# Patient Record
Sex: Male | Born: 1940 | Race: Asian | Hispanic: No | Marital: Married | Smoking: Former smoker
Health system: Southern US, Community
[De-identification: ages and names within clinical notes are randomized; demographics above are authoritative.]

## PROBLEM LIST (undated history)

## (undated) DIAGNOSIS — I10 Essential (primary) hypertension: Secondary | ICD-10-CM

## (undated) DIAGNOSIS — R51 Headache: Secondary | ICD-10-CM

## (undated) DIAGNOSIS — R519 Headache, unspecified: Secondary | ICD-10-CM

## (undated) DIAGNOSIS — J189 Pneumonia, unspecified organism: Secondary | ICD-10-CM

## (undated) DIAGNOSIS — I639 Cerebral infarction, unspecified: Secondary | ICD-10-CM

## (undated) DIAGNOSIS — F329 Major depressive disorder, single episode, unspecified: Secondary | ICD-10-CM

## (undated) DIAGNOSIS — I252 Old myocardial infarction: Secondary | ICD-10-CM

## (undated) DIAGNOSIS — F32A Depression, unspecified: Secondary | ICD-10-CM

## (undated) DIAGNOSIS — I209 Angina pectoris, unspecified: Secondary | ICD-10-CM

## (undated) DIAGNOSIS — N400 Enlarged prostate without lower urinary tract symptoms: Secondary | ICD-10-CM

## (undated) HISTORY — PX: CATARACT EXTRACTION, BILATERAL: SHX1313

## (undated) HISTORY — DX: Old myocardial infarction: I25.2

---

## 2006-11-27 ENCOUNTER — Inpatient Hospital Stay (HOSPITAL_COMMUNITY): Admission: EM | Admit: 2006-11-27 | Discharge: 2006-11-29 | Payer: Self-pay | Admitting: Emergency Medicine

## 2007-05-16 HISTORY — PX: CARDIAC CATHETERIZATION: SHX172

## 2007-07-09 ENCOUNTER — Emergency Department (HOSPITAL_COMMUNITY): Admission: EM | Admit: 2007-07-09 | Discharge: 2007-07-10 | Payer: Self-pay | Admitting: Emergency Medicine

## 2007-09-10 ENCOUNTER — Ambulatory Visit (HOSPITAL_COMMUNITY): Admission: RE | Admit: 2007-09-10 | Discharge: 2007-09-10 | Payer: Self-pay | Admitting: Chiropractic Medicine

## 2009-07-02 ENCOUNTER — Ambulatory Visit: Payer: Self-pay | Admitting: Family Medicine

## 2010-03-30 ENCOUNTER — Encounter (INDEPENDENT_AMBULATORY_CARE_PROVIDER_SITE_OTHER): Payer: Self-pay | Admitting: *Deleted

## 2010-03-30 LAB — CONVERTED CEMR LAB
CO2: 28 meq/L (ref 19–32)
Creatinine, Ser: 0.86 mg/dL (ref 0.40–1.50)
Glucose, Bld: 89 mg/dL (ref 70–99)
Sodium: 139 meq/L (ref 135–145)
TSH: 1.231 microintl units/mL (ref 0.350–4.500)
Total Bilirubin: 0.3 mg/dL (ref 0.3–1.2)
Total Protein: 7.5 g/dL (ref 6.0–8.3)

## 2010-09-27 NOTE — Cardiovascular Report (Signed)
NAMERomeo, Jacob Pope                    ACCOUNT NO.:  [id number]   MEDICAL RECORD NO.:  [id number]          PATIENT TYPE:  INP   LOCATION:  3707                         FACILITY:  [facility]   PHYSICIAN:  Nicki Guadalajara, M.D.     DATE OF BIRTH:  Mar 20, 1941   DATE OF PROCEDURE:  11/28/2006  DATE OF DISCHARGE:                            CARDIAC CATHETERIZATION   INDICATIONS:  [name] is a 70 year old [place]  gentleman who reportedly gives a history of suffering heart problems  with possible heart attack while in [place] several years ago.  Apparently, the patient was admitted yesterday by Dr. Elsie Lincoln with chest  pain.  The patient does not speak English and it was difficult to  elucidate a history.  Apparently Dr. Elsie Lincoln raised the possibility of  him having suffered an old anteroseptal MI.  Definitive diagnostic  catheterization was recommended.   PROCEDURE:  After premedication with Valium 5 mg intravenously, the  patient was prepped and draped in the usual fashion.  His right femoral  artery was punctured anteriorly and a 5-French sheath was inserted.  Diagnostic cardiac catheterization was done utilizing 5-French Judkins  for left and right coronary catheters.  A 5-French pigtail catheter was  used for biplane selective arteriography.  Distal aortography was also  performed.  Hemostasis was obtained by direct manual pressure.  The  patient tolerated the procedure well.   HEMODYNAMIC DATA:  Central aortic pressure was 160/74.  Left ventricular  pressure is 160/16.   ANGIOGRAPHIC DATA:  The left main coronary artery had an upper takeoff  and was angiographically normal and bifurcated into the LAD and left  circumflex system.   The LAD was angiographically normal, gave rise to two diagonal vessels,  several septal perforating arteries and wrapped around the LV apex.   The left circumflex vessel gave rise to a small high  marginal/intermediate like vessel and one large  obtuse marginal vessel  and was free of significant disease.   The right coronary artery was a large-caliber dominant vessel which was  angiographically normal.  The initial injection did raise the  possibility of possible spasm proximally but subsequent injections  showed this had completely resolved spontaneously.   Biplane selective arteriography revealed preserved global contractility.  On the LAO projection there was a questionable subtle region of minimal  inferolateral hypercontractility but this was borderline and still  contracted well when compared to the other walls.   Distal aortography did not demonstrate any renal artery stenosis.  There  was no significant aortoiliac disease.   IMPRESSION:  1. Normal LV function with questionable borderline subtle region of      very minimal hypocontractility inferolaterally.  2. Essentially normal coronary arteries.           ______________________________  Nicki Guadalajara, M.D.     TK/MEDQ  D:  11/28/2006  T:  11/29/2006  Job:  [id number]   cc:   Madaline Savage, M.D.

## 2010-09-27 NOTE — Discharge Summary (Signed)
NAME:  Jacob Pope, [name]                    ACCOUNT NO.:  [id number]   MEDICAL RECORD NO.:  [id number]          PATIENT TYPE:  INP   LOCATION:  3707                         FACILITY:  [facility]   PHYSICIAN:  Abelino Derrick, P.A.   DATE OF BIRTH:  August 30, 1940   DATE OF ADMISSION:  11/27/2006  DATE OF DISCHARGE:                               DISCHARGE SUMMARY   DISCHARGE DIAGNOSES:  1. Unstable angina on admission with catheterization this admission      revealing normal coronaries.  2. Past history of myocardial infarction x2 per the patient.  3. Hypertension.  4. History of smoking.   HOSPITAL COURSE:  Patient is a 70 year old [place] male, who has been  in this country 2-1/2 months.  His daughter has been here 8 years and  she helped [place] with translation.  Apparently, according to the patient,  he had a MI x2 in [place] in the past.  He was on indapamide for  hypertension prior to admission.  He presented 11/27/06 with substernal  chest pain, worrisome for unstable angina.  He was seen by Dr. Elsie Lincoln  and admitted to telemetry, started on IV heparin, beta blocker, Norvasc,  ACE inhibitor and statin, and an aspirin.  Troponin were negative.  He  was set up for diagnostic catheterization, which was done 11/28/06 and  revealed normal coronaries and normal LV function, normal renal arteries  and normal iliacs.  He tolerated this well.  We feel he can be  discharged November 29, 2006.  We will trim his medicines back to just  lisinopril 5 mg once a day for hypertension and an aspirin a day.  He  will see Dr. Elsie Lincoln in followup.  He will need a primary care doctor,  Dr. Elsie Lincoln suggested Dr. Mikeal Hawthorne.   LABORATORY DATA:  White count 7.9, hemoglobin 14.5, hematocrit 44.4,  platelets 187, sodium 137, potassium 4.4, BUN 9, creatinine 0.9.  Liver  functions are normal.  CK-MB and troponin are negative x2.  LDL is 145.  HDL is 39.  BNP is less than 30.  TSH 1.0.  Chest x-ray - no acute  findings.  INR 0.9.   EKG shows sinus rhythm, sinus bradycardia, left  axis deviation, Q wave in V1 and 2.   DISPOSITION:  Patient is discharged in stable condition and will follow  up with Dr. Elsie Lincoln as an outpatient.  He may need further adjustment of  his medications.      Abelino Derrick, P.ALenard Lance  D:  11/29/2006  T:  11/29/2006  Job:  [id number]   cc:   Madaline Savage, M.D.

## 2011-02-27 LAB — I-STAT 8, (EC8 V) (CONVERTED LAB)
Acid-Base Excess: 3 — ABNORMAL HIGH
BUN: 16
Bicarbonate: 27.6 — ABNORMAL HIGH
Chloride: 104
Glucose, Bld: 97
HCT: 47
Hemoglobin: 16
Operator id: 189501
Potassium: 3.6
Sodium: 139
TCO2: 29
pCO2, Ven: 40.8 — ABNORMAL LOW
pH, Ven: 7.438 — ABNORMAL HIGH

## 2011-02-27 LAB — COMPREHENSIVE METABOLIC PANEL
ALT: 31
Albumin: 3.5
Alkaline Phosphatase: 45
BUN: 15
Chloride: 101
Glucose, Bld: 79
Potassium: 3.5
Sodium: 135
Total Bilirubin: 0.4

## 2011-02-27 LAB — POCT CARDIAC MARKERS
CKMB, poc: 1.1
CKMB, poc: 1.3
Myoglobin, poc: 40.2
Myoglobin, poc: 45.6
Myoglobin, poc: 45.7
Operator id: 189501
Operator id: 189501
Troponin i, poc: <0.05

## 2011-02-27 LAB — LIPID PANEL
Cholesterol: 200
LDL Cholesterol: 145 — ABNORMAL HIGH
Triglycerides: 81
VLDL: 16

## 2011-02-27 LAB — CBC
HCT: 42.4
Hemoglobin: 14
MCHC: 33
MCV: 80.2
Platelets: 187
RBC: 5.35
RBC: 5.53
RDW: 14.4 — ABNORMAL HIGH
WBC: 7.9

## 2011-02-27 LAB — HEPARIN LEVEL (UNFRACTIONATED): Heparin Unfractionated: <0.1 — ABNORMAL LOW

## 2011-02-27 LAB — CARDIAC PANEL(CRET KIN+CKTOT+MB+TROPI)
CK, MB: 2.6
CK, MB: 2.8
Relative Index: 1.8
Relative Index: 2.2
Total CK: 129

## 2011-02-27 LAB — WOUND CULTURE: Culture: NO GROWTH

## 2011-02-27 LAB — POCT I-STAT CREATININE
Creatinine, Ser: 1.1
Operator id: 189501

## 2011-02-27 LAB — ANAEROBIC CULTURE

## 2011-02-27 LAB — BASIC METABOLIC PANEL
BUN: 9
Chloride: 104
Creatinine, Ser: 0.92
GFR calc Af Amer: >60
GFR calc non Af Amer: >60

## 2011-02-27 LAB — TSH: TSH: 1.01

## 2011-02-27 LAB — PROTIME-INR
INR: 0.9
Prothrombin Time: 12.5

## 2011-02-27 LAB — B-NATRIURETIC PEPTIDE (CONVERTED LAB): Pro B Natriuretic peptide (BNP): <30

## 2011-08-05 ENCOUNTER — Other Ambulatory Visit: Payer: Self-pay

## 2011-08-05 ENCOUNTER — Emergency Department (HOSPITAL_COMMUNITY): Payer: Self-pay

## 2011-08-05 ENCOUNTER — Encounter (HOSPITAL_COMMUNITY): Payer: Self-pay | Admitting: Emergency Medicine

## 2011-08-05 ENCOUNTER — Emergency Department (HOSPITAL_COMMUNITY)
Admission: EM | Admit: 2011-08-05 | Discharge: 2011-08-05 | Disposition: A | Discharge status: Home / Self Care | Payer: Self-pay | Attending: Emergency Medicine | Admitting: Emergency Medicine

## 2011-08-05 DIAGNOSIS — R5381 Other malaise: Secondary | ICD-10-CM | POA: Insufficient documentation

## 2011-08-05 DIAGNOSIS — R059 Cough, unspecified: Secondary | ICD-10-CM | POA: Insufficient documentation

## 2011-08-05 DIAGNOSIS — Z79899 Other long term (current) drug therapy: Secondary | ICD-10-CM | POA: Insufficient documentation

## 2011-08-05 DIAGNOSIS — E871 Hypo-osmolality and hyponatremia: Secondary | ICD-10-CM | POA: Insufficient documentation

## 2011-08-05 DIAGNOSIS — R05 Cough: Secondary | ICD-10-CM | POA: Insufficient documentation

## 2011-08-05 DIAGNOSIS — R6883 Chills (without fever): Secondary | ICD-10-CM | POA: Insufficient documentation

## 2011-08-05 DIAGNOSIS — R51 Headache: Secondary | ICD-10-CM | POA: Insufficient documentation

## 2011-08-05 DIAGNOSIS — R0789 Other chest pain: Secondary | ICD-10-CM | POA: Insufficient documentation

## 2011-08-05 DIAGNOSIS — I10 Essential (primary) hypertension: Secondary | ICD-10-CM | POA: Insufficient documentation

## 2011-08-05 DIAGNOSIS — R0602 Shortness of breath: Secondary | ICD-10-CM | POA: Insufficient documentation

## 2011-08-05 DIAGNOSIS — J189 Pneumonia, unspecified organism: Secondary | ICD-10-CM | POA: Insufficient documentation

## 2011-08-05 HISTORY — DX: Essential (primary) hypertension: I10

## 2011-08-05 LAB — BASIC METABOLIC PANEL
BUN: 9 mg/dL (ref 6–23)
CO2: 26 mEq/L (ref 19–32)
Chloride: 94 mEq/L — ABNORMAL LOW (ref 96–112)
Creatinine, Ser: 0.85 mg/dL (ref 0.50–1.35)
GFR calc Af Amer: >90 mL/min (ref >90)
Glucose, Bld: 100 mg/dL — ABNORMAL HIGH (ref 70–99)
Potassium: 3.8 mEq/L (ref 3.5–5.1)

## 2011-08-05 LAB — DIFFERENTIAL
Basophils Relative: 1 % (ref 0–1)
Lymphocytes Relative: 43 % (ref 12–46)
Lymphs Abs: 1.8 10*3/uL (ref 0.7–4.0)
Monocytes Absolute: 0.7 10*3/uL (ref 0.1–1.0)
Monocytes Relative: 16 % — ABNORMAL HIGH (ref 3–12)
Neutro Abs: 1.6 10*3/uL — ABNORMAL LOW (ref 1.7–7.7)
Neutrophils Relative %: 39 % — ABNORMAL LOW (ref 43–77)

## 2011-08-05 LAB — CBC
HCT: 40.4 % (ref 39.0–52.0)
Hemoglobin: 13.9 g/dL (ref 13.0–17.0)
MCHC: 34.4 g/dL (ref 30.0–36.0)
RBC: 5.18 MIL/uL (ref 4.22–5.81)
WBC: 4.4 10*3/uL (ref 4.0–10.5)

## 2011-08-05 MED ORDER — DEXTROSE 5 % IV SOLN: 500.0000 mg | Freq: Once | INTRAVENOUS | Status: DC

## 2011-08-05 MED ORDER — SODIUM CHLORIDE 0.9 % IV SOLN
Freq: Once | INTRAVENOUS | Status: AC
  Administered 2011-08-05: 18:00:00 via INTRAVENOUS

## 2011-08-05 MED ORDER — SODIUM CHLORIDE 0.9 % IV BOLUS (SEPSIS)
1000.0000 mL | Freq: Once | INTRAVENOUS | Status: AC
  Administered 2011-08-05: 1000 mL via INTRAVENOUS

## 2011-08-05 MED ORDER — ACETAMINOPHEN 325 MG PO TABS
650.0000 mg | ORAL_TABLET | Freq: Once | ORAL | Status: AC
  Administered 2011-08-05: 650 mg via ORAL
  Filled 2011-08-05: qty 2

## 2011-08-05 MED ORDER — DEXTROSE 5 % IV SOLN: 1.0000 g | Freq: Once | INTRAVENOUS | Status: DC

## 2011-08-05 MED ORDER — MOXIFLOXACIN HCL IN NACL 400 MG/250ML IV SOLN
400.0000 mg | Freq: Once | INTRAVENOUS | Status: AC
  Administered 2011-08-05: 400 mg via INTRAVENOUS
  Filled 2011-08-05: qty 250

## 2011-08-05 MED ORDER — MOXIFLOXACIN HCL 400 MG PO TABS: 400.0000 mg | ORAL_TABLET | Freq: Every day | ORAL | Status: AC

## 2011-08-05 NOTE — ED Notes (Signed)
Pt's daughter, Vanecek, [phone], went to cafeteria and will return.

## 2011-08-05 NOTE — Discharge Instructions (Signed)
Jacob Pope lab work showed slightly low sodium level, otherwise normal. His chest x-ray showed pneumonia. Take avelox as prescribed for the infection. Follow up with primary care doctor in 3-5 days for recheck. His MRI showed no acute/new findings to explain his symptoms. It also showed what looked like a possible prior hemorraghic stroke or trauma. This is something he needs to follow up with his neurologist for next week. Return if symptoms are worsening.   B?nh Vim Ph?i ? Ng??i L?n (Pneumonia, Adult) Vim ph?i l b?nh nhi?m trng ph?i.  NGUYN NHN N c th? gy ra b?i vi khu?n ho?c vi rt. Thng th??ng, cc nhi?m trng ny l do ht cc h?t truy?n nhi?m vo ph?i (???ng h h?p). TRI?U CH?NG   Ho.   S?t.   ?au ng?c.   Th? nhanh.   Kh kh.   S?n sinh d?ch nh?y.  CH?N ?ON N?u b?n c cc tri?u ch?ng ph? bi?n c?a b?nh vim ph?i, chuyn gia ch?m [place] y t? c?a b?n th??ng s? xc nh?n ch?n ?on b?ng X-quang ph?i. X-quang s? hi?n th? s? b?t th??ng trong ph?i (thm nh?p vo ph?i) n?u b?n b? vim ph?i. Cc xt nghi?m mu, n??c ti?u ho?c ??m c th? ???c th?c hi?n ?? tm ra nguyn nhn c? th? c?a b?nh vim ph?i c?a b?n. Chuyn gia ch?m [place] y t? c?a b?n c th? lm cc xt nghi?m (?o p l?c kh trong mu ho?c ?o ?? bo ha -xy) ?? xem ph?i c?a b?n ?ang lm vi?c nh? th? no. ?I?U TR?  M?t s? d?ng vim ph?i c th? ly lan sang ng??i khc khi b?n ho ho?c h?t h?i. B?n c th? ???c yu c?u ?eo kh?u trang tr??c v trong qu trnh khm. Vim ph?i do vi khu?n ???c ?i?u tr? b?ng thu?c khng sinh. Vim ph?i do vi rt cm c th? ???c ?i?u tr? b?ng thu?c khng vi rt. H?u h?t cc b?nh nhi?m vi rt khc ph?i ?i h?t ti?n trnh c?a chng. Cc b?nh nhi?m trng ny s? khng ?p ?ng v?i thu?c khng sinh.  PHNG NG?A Thu?c ch?ng ng?a (lo?i tim) nhi?m ph? c?u trng gip phng ng?a vim ph?i do ph? c?u trng. ?i?u ny th??ng ???c ?? ngh? cho:  Nh?ng ng??i trn 64 tu?i.   B?nh nhn ha tr? li?u.   Nh?ng ng??i c v?n  ?? v? ph?i mn tnh, ch?ng h?n nh? vim ti?u ph? qu?n ho?c kh th?ng.   Nh?ng ng??i c v?n ?? h? th?ng mi?n d?ch.  N?u b?n trn 64 tu?i ho?c c m?t tnh tr?ng nguy c? cao, b?n c th? nh?n ???c v?cxin ph? c?u khu?n n?u b?n khng nh?n ???c n tr??c ?. ? m?t s? n??c, v?cxin cm th??ng xuyn c?ng ???c ?? ngh?Marland Kitchen V?cxin ny c th? gip phng ng?a m?t s? tr??ng h?p vim ph?i. B?n c th? s? ???c cung c?p v?cxin cm nh? l m?t ph?n c?a s? ch?m [place].  N?u b?n ht thu?c, ? t?i lc b? thu?c. B?n c th? ???c h??ng d?n v? cch d?ng ht thu?c t?t nh?t. Chuyn gia ch?m [place] y t? c?a b?n c th? cung c?p thu?c v t? v?n ?? gip b?n b? thu?c l. H??NG D?N CH?M [place] T?I NH  Thu?c ho c th? ???c s? d?ng n?u b?n khng ???c ngh? ng?i nhi?u. Tuy nhin, ho b?o v? b?n b?ng cch lm s?ch ph?i. B?n nn trnh s? d?ng thu?c ho n?u c th?Shaune Pascal gia ch?m [place] y t? c?a b?n  c th? ? k ??n thu?c n?u ngh? r?ng b?n b? vim do vi khu?n ho?c cm. Dng h?t chng ngay c? khi b?n b?t ??u c?m th?y kh h?n.   Bc s? ? c?ng c th? k ??n thu?c long ??m c tc d?ng lm bong ??m ?? ho ra.   Ch? dng cc thu?c ???c bn khng c?n ??n thu?c c?a Bc s? ho?c theo toa c?a Bc s? ?? gi?m ?au, kh ch?u, hay s?t theo nh? h??ng d?n c?a Bc s?.   Khng ht thu?c l. Ht thu?c l l nguyn nhn ph? bi?n c?a vim ph? qu?n v c th? gp ph?n vo vim ph?i. N?u b?n ht thu?c v ti?p t?c ht thu?c, ch?ng ho c?a b?n c th? ko di vi tu?n sau khi h?t vim ph?i.   My phun h?i n??c mt ho?c my t?o h?i ?m trong phng hay nh b?n c th? gip lm long d?ch nh?y.   Ho th??ng n?ng h?n vo ban ?m. Ng? ? t? th? n?a th?ng ??ng trong m?t chi?c gh? t?a ho?c s? d?ng m?t ?i g?i d??i ??u s? gip lm d?u b?t v?n ?? ny.   Ngh? ng?i khi b?n c?m th?y c?n. C? th? c?a b?n th??ng s? cho b?n bi?t khi no c?n ngh? ng?i.  HY NGAY L?P T?C THAM V?N V?I CHUYN GIA Y T? N?U:  B?nh c?a b?n tr? nn t? h?n. ?i?u ny ??c bi?t ?ng n?u b?n ? c tu?i ho?c b? suy nh??c do b?t  k? b?nh no khc.   B?n khng th? ki?m sot b?nh ho b?ng thu?c ch?a ho v m?t ng?.   B?n b?t ??u ho ra mu.   B?n ngy cng ?au h?n ho?c u?ng thu?c khng c tc d?ng gi?m ?au.   B?n b? s?t.   B?t k? tri?u ch?ng no ban ??u ??a b?n ??n ?i?u tr? tr? nn nghim tr?ng h?n thay v t?t h?n.   B?n pht tri?n th? d?c ho?c ?au ng?c.  HY CH?C CH?N R?NG B?N:  Hi?u r nh?ng h??ng d?n khi xu?t vi?n.   S? theo di tnh tr?ng b?nh c?a b?n.   S? ??n khm b?nh ngay l?p t?c nh? ? ???c h??ng d?n.  Document Released: 05/01/2005 Document Revised: 04/20/2011 [facility] Patient Information 2012 [place], [place].

## 2011-08-05 NOTE — ED Provider Notes (Signed)
Pt headaches, weakness, elevated BP. Here in CDU awaiting CXR, MRI of  The head.  Filed Vitals:   08/05/11 1931  BP: 129/76  Pulse: 62  Temp: 97.8 F (36.6 C)  Resp: 22   Pt in NAD. Neurologically intact. AAOx3. Lungs clear bilaterally, regular HR and rhythm.   CAP on CXR. Avelox IV given in ED. PORT score of 71, Curb 65 score of 1, which puts him at a low risk group and outpatient treatment is appropriate. MRI back showing no acute findings, however, possible prior hemorrhagic ischemia or trauma. Results discussed with Dr. Landry Dyke and Dr.Guess who do not believe any further treatment necessary in ER or inpatient. Pt's VS are normal. He will be d/c home with outpatient follow up with neurology at [facility], and antibiotics with follow up with PCP for recheck.   Lottie Mussel, PA 08/05/11 2030

## 2011-08-05 NOTE — ED Notes (Signed)
Daughter stated, he's got headache and high blood pressure. WEnt to [facility]' for  The same and is scheduled for MRI

## 2011-08-05 NOTE — ED Provider Notes (Signed)
I saw and evaluated the patient, reviewed the resident's note and I agree with the findings and plan including ECG.  Intermittent headaches over the last couple weeks which can last for several minutes to several hours at a time which seemed to be a gradual onset but apparently when he has his headaches he has gait ataxia or resolves when his headaches resolved. He is no change in speech vision swallowing or understanding and no lateralizing weakness or numbness. When he is having his headaches when he is sitting still he apparently can feel and move his arms without obvious incoordination but when he tries to walk he apparently keeps bumping into the walls and it is difficult to tell with the language barrier whether or not he is having lightheadedness, vertigo, true gait ataxia, or just feels generally weak. As far as I can tell there've been no sudden onset headaches and no severe headaches after onset either. Headaches are gradual throbbing headache. He also has a cough with his lung examination essentially clear except for a few rhonchi and crackles at the left base only with unlabored breathing in the emergency department.   Hurman Horn, MD 08/07/11 2236

## 2011-08-05 NOTE — ED Notes (Signed)
Boneta Lucks, RN, gave report - pt in MRI at this time.

## 2011-08-05 NOTE — ED Notes (Signed)
Was seen and examined by Dr. Jamas Lav

## 2011-08-05 NOTE — ED Provider Notes (Signed)
History     CSN: [id number]  Arrival date & time 08/05/11  1312   First MD Initiated Contact with Patient 08/05/11 1420      Chief Complaint  Patient presents with  . Hypertension    (Consider location/radiation/quality/duration/timing/severity/associated sxs/prior treatment) HPI CC cough associated with chills, sob, chest pressure and fatigue onset 2 days ago.  Sx's worse with exertion, relieved with rest.  Also c/o left sided HA that has been present off and on for 3 weeks.  Mild, no associated sx's.  Seen by pcp and scheduled for outpt mri.   Past Medical History  Diagnosis Date  . Hypertension     History reviewed. No pertinent past surgical history.  History reviewed. No pertinent family history.  History  Substance Use Topics  . Smoking status: Current Everyday Smoker  . Smokeless tobacco: Not on file  . Alcohol Use: No      Review of Systems  Constitutional: Positive for chills and fatigue.  HENT: Negative for neck pain and neck stiffness.   Respiratory: Positive for cough and shortness of breath.   Cardiovascular: Positive for chest pain.  Gastrointestinal: Negative for nausea, vomiting, abdominal pain and diarrhea.  Neurological: Positive for headaches. Negative for weakness and numbness.  All other systems reviewed and are negative.    Allergies  Review of patient's allergies indicates no known allergies.  Home Medications   Current Outpatient Rx  Name Route Sig Dispense Refill  . LISINOPRIL 30 MG PO TABS Oral Take 30 mg by mouth daily.    . OSELTAMIVIR PHOSPHATE 75 MG PO CAPS Oral Take 75 mg by mouth daily. For 5 days; Start date 08/04/11    . MOXIFLOXACIN HCL 400 MG PO TABS Oral Take 1 tablet (400 mg total) by mouth daily. 7 tablet 0    BP 113/69  Pulse 64  Temp(Src) 97.8 F (36.6 C) (Oral)  Resp 21  SpO2 96%ra wnl  Physical Exam  Nursing note and vitals reviewed. Constitutional: He appears well-developed and well-nourished.  HENT:    Head: Normocephalic and atraumatic.  Eyes: EOM are normal. Pupils are equal, round, and reactive to light. Right eye exhibits no discharge. Left eye exhibits no discharge. Right eye exhibits no nystagmus. Left eye exhibits no nystagmus.  Neck: Normal range of motion. Neck supple.  Cardiovascular: Normal rate, regular rhythm and normal heart sounds.   Pulmonary/Chest: Effort normal. He has rhonchi (mild) in the right middle field and the right lower field.  Abdominal: Soft. There is no tenderness.  Musculoskeletal: Normal range of motion. He exhibits no tenderness.  Neurological: He is alert. He has normal strength. No sensory deficit. Coordination (good trunk and extremity coordination) normal. GCS eye subscore is 4. GCS verbal subscore is 5. GCS motor subscore is 6.  Skin: Skin is warm and dry.  Psychiatric: He has a normal mood and affect. His behavior is normal.    ED Course  Procedures (including critical care time)  Labs Reviewed  DIFFERENTIAL - Abnormal; Notable for the following:    Neutrophils Relative 39 (*)    Neutro Abs 1.6 (*)    Monocytes Relative 16 (*)    All other components within normal limits  BASIC METABOLIC PANEL - Abnormal; Notable for the following:    Sodium 129 (*)    Chloride 94 (*)    Glucose, Bld 100 (*)    GFR calc non Af Amer 86 (*)    All other components within normal limits  CBC  POCT  I-STAT TROPONIN I   Dg Chest 2 View  08/05/2011  *RADIOLOGY REPORT*  Clinical Data: Cough.  Shortness of breath.  Chills.  Headache. Smoker.  CHEST - 2 VIEW 08/05/2011:  Comparison: One-view chest x-ray 07/09/2007 and two-view chest x- ray 11/28/2006 [facility].  Findings: Cardiac silhouette mildly enlarged but stable.  Thoracic aorta atherosclerotic, unchanged.  Hilar and mediastinal contours otherwise unremarkable.  Streaky opacity with increased bronchovascular markings in the right lower lobe.  Lungs otherwise clear.  No pleural effusions.  Degenerative  changes involving the lower lumbar spine.  IMPRESSION: Right lower lobe bronchopneumonia.  Original Report Authenticated By: Arnell Sieving, M.D.   Mr [facility] Wo Contrast  08/05/2011  *RADIOLOGY REPORT*  Clinical Data:  Week for several days.  Headache.  MRI HEAD WITHOUT CONTRAST MRA HEAD WITHOUT CONTRAST  Technique: Multiplanar, multiecho pulse sequences of the brain and surrounding structures were obtained according to standard protocol without intravenous contrast.  Angiographic images of the head were obtained using MRA technique without contrast.  Comparison: 07/09/2007 head CT.  No comparison brain MR.  MRI HEAD  Findings:  No acute infarct.  Small area of blood breakdown products left cerebellum may be related to prior hemorrhagic ischemia or trauma.  Tiny cavernoma not entirely excluded.  Mild to slightly moderate small vessel disease type changes.  No intracranial mass lesion detected on this unenhanced motion degraded exam.  No hydrocephalus.  Partially empty sella incidentally noted.  Minimal ethmoid sinus and maxillary sinus mucosal thickening.  Mild spinal stenosis C3-4.  IMPRESSION: No acute infarct.  Small vessel disease type changes.  Please see above.  MRA HEAD  Findings: Anterior circulation without medium or large size vessel significant stenosis or occlusion.  Mild branch vessel irregularity.  Ectatic vertebral arteries and basilar artery without high-grade stenosis.  Left PICA not completely imaged.  Posterior cerebral artery mild branch vessel irregularity.  No aneurysm or vascular malformation noted.  IMPRESSION: Branch vessel atherosclerotic type changes as noted above.  Original Report Authenticated By: Fuller Canada, M.D.   Mr Brain Wo Contrast  08/05/2011  *RADIOLOGY REPORT*  Clinical Data:  Week for several days.  Headache.  MRI HEAD WITHOUT CONTRAST MRA HEAD WITHOUT CONTRAST  Technique: Multiplanar, multiecho pulse sequences of the brain and surrounding structures were  obtained according to standard protocol without intravenous contrast.  Angiographic images of the head were obtained using MRA technique without contrast.  Comparison: 07/09/2007 head CT.  No comparison brain MR.  MRI HEAD  Findings:  No acute infarct.  Small area of blood breakdown products left cerebellum may be related to prior hemorrhagic ischemia or trauma.  Tiny cavernoma not entirely excluded.  Mild to slightly moderate small vessel disease type changes.  No intracranial mass lesion detected on this unenhanced motion degraded exam.  No hydrocephalus.  Partially empty sella incidentally noted.  Minimal ethmoid sinus and maxillary sinus mucosal thickening.  Mild spinal stenosis C3-4.  IMPRESSION: No acute infarct.  Small vessel disease type changes.  Please see above.  MRA HEAD  Findings: Anterior circulation without medium or large size vessel significant stenosis or occlusion.  Mild branch vessel irregularity.  Ectatic vertebral arteries and basilar artery without high-grade stenosis.  Left PICA not completely imaged.  Posterior cerebral artery mild branch vessel irregularity.  No aneurysm or vascular malformation noted.  IMPRESSION: Branch vessel atherosclerotic type changes as noted above.  Original Report Authenticated By: Fuller Canada, M.D.     1. CAP (community acquired  pneumonia)   2. Headache   3. Hyponatremia      EKG: unchanged from previous tracings, LAD, normal sinus rhythm, no st/t changes.  MDM  Pt is in nad, afvss, nontoxic appearing, exam and hx as above. C/f pna, getting cxr and labs.  Doubt acs, ekg nl, had cath 4.5 yrs ago that was clean, getting trop.  Wells score is 0, not tachy, no dvt s/sx, no hypoxia, doubt PE.    Care transferred to CDU, labs and imaging pending      Elijio Miles, MD 08/06/11 [phone]

## 2011-08-05 NOTE — ED Notes (Signed)
Called MRI and they are preparing a cd for pt to take with him to [facility] next week.

## 2011-08-07 NOTE — ED Provider Notes (Signed)
Medical screening examination/treatment/procedure(s) were conducted as a shared visit with non-physician practitioner(s) and myself.  I personally evaluated the patient during the encounter  Hurman Horn, MD 08/07/11 2311

## 2011-10-08 ENCOUNTER — Encounter: Payer: Self-pay | Admitting: *Deleted

## 2011-11-07 ENCOUNTER — Emergency Department (HOSPITAL_COMMUNITY)
Admission: EM | Admit: 2011-11-07 | Discharge: 2011-11-07 | Disposition: A | Discharge status: Home / Self Care | Payer: Medicare Other | Attending: Emergency Medicine | Admitting: Emergency Medicine

## 2011-11-07 ENCOUNTER — Emergency Department (HOSPITAL_COMMUNITY): Payer: Medicare Other

## 2011-11-07 ENCOUNTER — Encounter (HOSPITAL_COMMUNITY): Payer: Self-pay | Admitting: *Deleted

## 2011-11-07 DIAGNOSIS — I252 Old myocardial infarction: Secondary | ICD-10-CM | POA: Diagnosis not present

## 2011-11-07 DIAGNOSIS — Z87891 Personal history of nicotine dependence: Secondary | ICD-10-CM | POA: Diagnosis not present

## 2011-11-07 DIAGNOSIS — I10 Essential (primary) hypertension: Secondary | ICD-10-CM | POA: Insufficient documentation

## 2011-11-07 DIAGNOSIS — J40 Bronchitis, not specified as acute or chronic: Secondary | ICD-10-CM | POA: Diagnosis not present

## 2011-11-07 DIAGNOSIS — R0789 Other chest pain: Secondary | ICD-10-CM | POA: Diagnosis not present

## 2011-11-07 DIAGNOSIS — R05 Cough: Secondary | ICD-10-CM | POA: Diagnosis not present

## 2011-11-07 DIAGNOSIS — J209 Acute bronchitis, unspecified: Secondary | ICD-10-CM | POA: Diagnosis not present

## 2011-11-07 LAB — CBC
HCT: 41.1 % (ref 39.0–52.0)
MCHC: 33.1 g/dL (ref 30.0–36.0)
RDW: 14.4 % (ref 11.5–15.5)

## 2011-11-07 LAB — BASIC METABOLIC PANEL
Calcium: 8.7 mg/dL (ref 8.4–10.5)
Chloride: 97 mEq/L (ref 96–112)
Creatinine, Ser: 1.02 mg/dL (ref 0.50–1.35)
GFR calc Af Amer: 83 mL/min — ABNORMAL LOW (ref >90)
GFR calc non Af Amer: 72 mL/min — ABNORMAL LOW (ref >90)

## 2011-11-07 LAB — DIFFERENTIAL
Basophils Absolute: 0.1 10*3/uL (ref 0.0–0.1)
Basophils Relative: 1 % (ref 0–1)
Eosinophils Absolute: 0.2 10*3/uL (ref 0.0–0.7)
Monocytes Absolute: 0.8 10*3/uL (ref 0.1–1.0)
Neutro Abs: 4.6 10*3/uL (ref 1.7–7.7)
Neutrophils Relative %: 63 % (ref 43–77)

## 2011-11-07 MED ORDER — BENZONATATE 100 MG PO CAPS: 100.0000 mg | ORAL_CAPSULE | Freq: Three times a day (TID) | ORAL | Status: AC | PRN

## 2011-11-07 NOTE — ED Notes (Signed)
Per wife, pt reports diaphoresis and "cold sweats" for the past 3 months. States that began to have a productive cough 3 days ago. Coughing up moderate amounts of white sputum. Hx of pneumonia.

## 2011-11-07 NOTE — ED Provider Notes (Signed)
History     CSN: [id number]  Arrival date & time 11/07/11  1140   First MD Initiated Contact with Patient 11/07/11 1529      Chief Complaint  Patient presents with  . 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 The patient reports that since his initial pneumonia, he is maintained feeling cold especially at night, and more recently has had night sweats. Further he reports a productive cough with white sputum. He denies any fevers. He does endorse his cough is a little bit worse at night. He denies any chest pain or pressure. He denies any arm or jaw pain. He denies any leg swelling. He denies any changes in bowel or bladder habits. He denies any obvious dyspnea on exertion. He denies paroxysmal nocturnal dyspnea. He denies a sense of suffocation.  Because his symptoms moderate. He denies travel to other countries. Nothing makes his symptoms better or worse.  Past Medical History  Diagnosis Date  . Hypertension   . MI, old     x2    Past Surgical History  Procedure Date  . Cardiac catheterization     No family history on file.  History  Substance Use Topics  . Smoking status: Former Games developer  . Smokeless tobacco: Not on file  . Alcohol Use: No      Review of Systems Constitutional: Negative for fever and chills.  POS night sweats HENT: Negative for ear pain, sore throat and trouble swallowing.   Eyes: Negative for pain and visual disturbance.  Respiratory: POS for cough and neg shortness of breath.   Cardiovascular: Negative for chest pain and leg swelling.  Gastrointestinal: Negative for nausea, vomiting, abdominal pain and diarrhea.  Genitourinary:  Negative for dysuria, urgency and frequency.  Musculoskeletal: Negative for back pain and joint swelling.  Skin: Negative for rash and wound.  Neurological: Negative for dizziness, syncope, speech difficulty, weakness and numbness.   Allergies  Review of patient's allergies indicates no known allergies.  Home Medications   Current Outpatient Rx  Name Route Sig Dispense Refill  . LISINOPRIL 30 MG PO TABS Oral Take 30 mg by mouth daily.    . ADULT MULTIVITAMIN W/MINERALS CH Oral Take 1 tablet by mouth daily.    . OMEGA-3-ACID ETHYL ESTERS 1 G PO CAPS Oral Take 2 g by mouth daily.    Marland Kitchen BENZONATATE 100 MG PO CAPS Oral Take 1 capsule (100 mg total) by mouth 3 (three) times daily as needed for cough. 20 capsule 0    BP 127/62  Pulse 68  Temp 98 F (36.7 C) (Oral)  Resp 15  SpO2 96%  Physical Exam Consitutional: Pt in no acute distress.  Well appearing. Head: Normocephalic and atraumatic.  Eyes: Extraocular motion intact, no scleral icterus Neck: Supple without meningismus, mass, or overt JVD Respiratory: Effort normal and breath sounds normal. No respiratory distress. CV: Heart regular rate and regular rhythm (sinus), no obvious murmurs.  Pulses +2 and symmetric Abdomen: Soft, non-tender, non-distended. No rebound or guarding.  MSK: Extremities are atraumatic without deformity, ROM intact Skin: Warm, dry, intact Neuro: Alert and oriented, no motor deficit noted.   Psychiatric: Mood and affect are normal  EKG:  Rate:  Rythym Sinus  Interval 144  ms. Axis: LAD No gross conduction abnormalities  appreciated.  No gross ST or T-wave abnormalities appreciated.  Essentially unchanged.    ED Course  Procedures (including critical care time)  Labs Reviewed  BASIC METABOLIC PANEL - Abnormal; Notable for the following:    Potassium 3.3 (*)     Glucose, Bld 144 (*)     GFR calc non Af Amer 72 (*)     GFR calc Af Amer 83 (*)     All other components within normal limits  CBC    DIFFERENTIAL   Dg Chest 2 View  11/07/2011  *RADIOLOGY REPORT*  Clinical Data: Cough.  Chest discomfort.  CHEST - 2 VIEW  Comparison: 08/05/2011  Findings: Upper normal heart size.  Bronchitic changes.  Clear lungs.  No pleural effusion.  No pneumothorax.  IMPRESSION: No active cardiopulmonary disease.   Bronchitic changes are noted.  Original Report Authenticated By: Donavan Burnet, M.D.     1. Bronchitis       MDM    Patient feels he has a return of his pneumonia. However, exam not remarkable for any adventitious lung sounds. Normal vital signs.   Negative hypoxia.    No fevers at home. White productive cough. Also endorses night sweats.  Suggestive of viral bronchitis. Overall clinical picture however not consistent with blood-borne cancer or tuberculosis.  Chest x-ray and blood work. His presentation is not consistent with acute coronary syndrome or heart failure.  Chest x-ray suggests bronchitis. Not suggestive of pneumonia. The cervix at the patient's clinical picture. Patient given cough suppressant and discharged home to follow up with primary care. PT DC home stable.  Discussed with pt the clinical impression, treatment in the ED, and follow up plan.  We alslo discussed the indications for returning to the ED, which include shortness or breath, confusion, fever, new weakness or numbness, chest pain, or any other concerning symptom.  The pt understood the treatment and plan, is stable, and is able to leave the ED.            Larrie Kass, MD 11/07/11 [phone]

## 2011-11-07 NOTE — ED Notes (Addendum)
Patient with productive cough x 3 days, patient also states chills/sweats, patient states the cold feeling and sweats have been occuring x 3 months, patient states dx. with pnuemonia approx 3 months ago and this feeling is consistent with that diagnosis

## 2011-11-07 NOTE — Discharge Instructions (Signed)
Vim ph? qu?n (Bronchitis) Vim ph? qu?n l cch c? th? ph?n ?ng l?i s? t?n th??ng v/ho?c nhi?m trng (vim) c?a ph? qu?n. Ph? qu?n l ?ng d?n kh ko di t? kh qu?n ??n ph?i. Khi vim nhi?m n?ng c th? gy kh th?. NGUYN NHN S? vim nhi?m c th? gy ra b?i:  Vi rt.   Vi trng (vi khu?n).   B?i.   Ch?t gy d? ?ng.   Ch?t b?n v nhi?u ch?t kch thch khc.  Cc t? bo t?o thnh cu?ng ph?i ???c bao b?c b?i lng t? (mao). Nh?ng lng t? ny lin t?c ??p ln ra xa ph?i v? pha mi?ng. Nh? ? ng?n khng cho cc ch?t b?n ?i vo ph?i. Khi nh?ng t? bo ny tr? nn qu kch thch v khng th? th?c hi?n ch?c n?ng c?a chng, d?ch nh?y s? b?t ??u pht tri?n. ?i?u ny gy ra ho ??c tr?ng c?a vim ph? qu?n. Ho s? lm s?ch ph?i khi lng mao khng th? th?c hi?n ch?c n?ng c?a chng. Khi khng c c? hai c? c?u b?o v? ny, d?ch nh?y s? ??ng l?i trong ph?i. Khi ? vim ph?i s? pht tri?n.  Ht thu?c l l nguyn nhn ph? bi?n c?a vim ph? qu?n v c th? gp ph?n vo vim ph?i. Vi?c d?ng thi quen ny l m?t ?i?u quan tr?ng nh?t m b?n c th? lm ?? t? gip mnh. ?I?U TR?  Chuyn gia ch?m [place] y t? c th? k thu?c khng sinh cho b?n n?u b?n b? ho do vi khu?n. Ngoi ra, thu?c gip lm gin kh qu?n ?? b?n th? d? h?n. Chuyn gia ch?m [place] y t? c?ng c th? ?? xu?t ho?c k thu?c long ??m. Thu?c long ??m s? lm l?ng d?ch nh?y ?? c th? ho ra. Ch? s? d?ng thu?c mua tr?c ti?p t?i qu?y ho?c thu?c theo toa ?? gi?m ?au, gi?m s? kh ch?u ho?c h? s?t theo ch? d?n c?a chuyn gia ch?m [place] y t? c?a b?n.   Vi?c lo?i b? b?t c? ?i?u g gy ra v?n ?? (v d? nh? ht thu?c l) c  ngh?a quan tr?ng ??i v?i vi?c ng?n khng lm cho v?n ?? t?i t? h?n.   Thu?c ho c th? ???c k ?? ?i?u tr? tri?u ch?ng ho.   Thu?c ht c th? ???c k ?? gip ?i?u tr? cc tri?u ch?ng hi?n t?i v ?? gip ng?n ng?a v?n ?? l?p l?i.   Nh?ng ng??i b? vim ph? qu?n mn tnh (ti h?i) c th? c?n ph?i dng thu?c steroid.  HY NGAY L?P T?C THAM V?N V?I CHUYN  GIA Y T? N?U:  Trong qu trnh ?i?u tr? b?n pht tri?n ??m gi?ng m? h?n (c m?).   B?n b? s?t.   Tr? h?n 3 thng tu?i c nhi?t ?? ?o ? h?u mn l 102 F (38,9 C) ho?c cao h?n.   Tr? 3 thng tu?i ho?c nh? h?n c nhi?t ?? ?o ? h?u mn l 100,4 F (38 C) ho?c cao h?n.   Tnh tr?ng b?nh c?a b?n tr? nn n?ng h?n.   B?n th?y kh th? h?n, th? kh kh hay kh th?.  C?n tham v?n v?i chuyn gia y t? ngay l?p t?c n?u b?n l ng??i l?n tu?i ho?c ?ang b? b?t k? b?nh no khc. ??M B?O B?N:   Hi?u cc h??ng d?n ny.   S? theo di tnh tr?ng c?a mnh.   S? yu c?u tr? gip ngay l?p t?c n?u b?n c?m  th?y khng kh?e ho?c tnh tr?ng tr? nn t?i t? h?n.  Document Released: 05/01/2005 Document Revised: 04/20/2011 [facility] Patient Information 2012 [place], [place].

## 2011-11-07 NOTE — ED Notes (Signed)
Prescription given with discharge instructions.  

## 2011-11-07 NOTE — ED Notes (Signed)
MD at bedside. 

## 2011-11-07 NOTE — ED Provider Notes (Signed)
I have seen and examined this patient with the resident.  I agree with the resident's note, assessment and plan except as indicated.    Patient with subjective chills and night sweats with cough with a negative chest x-ray.  Likely bronchitis in a patient who is a nonsmoker.  Will treat as a viral bronchitis and have him followup with his primary care physician or return if he has worsening difficulty breathing.  Nat Christen, MD 11/07/11 [phone]

## 2011-11-08 NOTE — ED Provider Notes (Signed)
I have seen and examined this patient with the resident.  I agree with the resident's note, assessment and plan except as indicated.     Nat Christen, MD 11/08/11 [phone]

## 2011-11-30 DIAGNOSIS — I10 Essential (primary) hypertension: Secondary | ICD-10-CM | POA: Diagnosis not present

## 2011-11-30 DIAGNOSIS — R5381 Other malaise: Secondary | ICD-10-CM | POA: Diagnosis not present

## 2011-11-30 DIAGNOSIS — R0989 Other specified symptoms and signs involving the circulatory and respiratory systems: Secondary | ICD-10-CM | POA: Diagnosis not present

## 2011-12-04 DIAGNOSIS — R0602 Shortness of breath: Secondary | ICD-10-CM | POA: Diagnosis not present

## 2011-12-04 DIAGNOSIS — I10 Essential (primary) hypertension: Secondary | ICD-10-CM | POA: Diagnosis not present

## 2011-12-04 DIAGNOSIS — R5381 Other malaise: Secondary | ICD-10-CM | POA: Diagnosis not present

## 2011-12-04 DIAGNOSIS — R5383 Other fatigue: Secondary | ICD-10-CM | POA: Diagnosis not present

## 2011-12-06 DIAGNOSIS — R5381 Other malaise: Secondary | ICD-10-CM | POA: Diagnosis not present

## 2011-12-06 DIAGNOSIS — G4700 Insomnia, unspecified: Secondary | ICD-10-CM | POA: Diagnosis not present

## 2011-12-06 DIAGNOSIS — I10 Essential (primary) hypertension: Secondary | ICD-10-CM | POA: Diagnosis not present

## 2011-12-06 DIAGNOSIS — IMO0001 Reserved for inherently not codable concepts without codable children: Secondary | ICD-10-CM | POA: Diagnosis not present

## 2011-12-06 DIAGNOSIS — F329 Major depressive disorder, single episode, unspecified: Secondary | ICD-10-CM | POA: Diagnosis not present

## 2011-12-19 DIAGNOSIS — I10 Essential (primary) hypertension: Secondary | ICD-10-CM | POA: Diagnosis not present

## 2011-12-19 DIAGNOSIS — R0602 Shortness of breath: Secondary | ICD-10-CM | POA: Diagnosis not present

## 2011-12-19 DIAGNOSIS — R5383 Other fatigue: Secondary | ICD-10-CM | POA: Diagnosis not present

## 2012-01-01 DIAGNOSIS — R0609 Other forms of dyspnea: Secondary | ICD-10-CM | POA: Diagnosis not present

## 2012-01-01 DIAGNOSIS — R5381 Other malaise: Secondary | ICD-10-CM | POA: Diagnosis not present

## 2012-01-01 DIAGNOSIS — I10 Essential (primary) hypertension: Secondary | ICD-10-CM | POA: Diagnosis not present

## 2012-01-09 DIAGNOSIS — F329 Major depressive disorder, single episode, unspecified: Secondary | ICD-10-CM | POA: Diagnosis not present

## 2012-01-09 DIAGNOSIS — R21 Rash and other nonspecific skin eruption: Secondary | ICD-10-CM | POA: Diagnosis not present

## 2012-01-09 DIAGNOSIS — G4700 Insomnia, unspecified: Secondary | ICD-10-CM | POA: Diagnosis not present

## 2012-01-30 DIAGNOSIS — G4700 Insomnia, unspecified: Secondary | ICD-10-CM | POA: Diagnosis not present

## 2012-01-30 DIAGNOSIS — I10 Essential (primary) hypertension: Secondary | ICD-10-CM | POA: Diagnosis not present

## 2012-01-30 DIAGNOSIS — F329 Major depressive disorder, single episode, unspecified: Secondary | ICD-10-CM | POA: Diagnosis not present

## 2012-02-05 DIAGNOSIS — Z23 Encounter for immunization: Secondary | ICD-10-CM | POA: Diagnosis not present

## 2012-03-12 DIAGNOSIS — F068 Other specified mental disorders due to known physiological condition: Secondary | ICD-10-CM | POA: Diagnosis not present

## 2012-03-12 DIAGNOSIS — I10 Essential (primary) hypertension: Secondary | ICD-10-CM | POA: Diagnosis not present

## 2012-03-12 DIAGNOSIS — F329 Major depressive disorder, single episode, unspecified: Secondary | ICD-10-CM | POA: Diagnosis not present

## 2012-03-12 DIAGNOSIS — G4700 Insomnia, unspecified: Secondary | ICD-10-CM | POA: Diagnosis not present

## 2012-04-15 DIAGNOSIS — F068 Other specified mental disorders due to known physiological condition: Secondary | ICD-10-CM | POA: Diagnosis not present

## 2012-04-15 DIAGNOSIS — G478 Other sleep disorders: Secondary | ICD-10-CM | POA: Diagnosis not present

## 2012-04-15 DIAGNOSIS — G4700 Insomnia, unspecified: Secondary | ICD-10-CM | POA: Diagnosis not present

## 2012-04-15 DIAGNOSIS — F329 Major depressive disorder, single episode, unspecified: Secondary | ICD-10-CM | POA: Diagnosis not present

## 2012-05-20 ENCOUNTER — Other Ambulatory Visit: Payer: Self-pay | Admitting: Internal Medicine

## 2012-05-20 DIAGNOSIS — G478 Other sleep disorders: Secondary | ICD-10-CM | POA: Diagnosis not present

## 2012-05-20 DIAGNOSIS — F329 Major depressive disorder, single episode, unspecified: Secondary | ICD-10-CM | POA: Diagnosis not present

## 2012-05-20 DIAGNOSIS — R51 Headache: Secondary | ICD-10-CM

## 2012-05-20 DIAGNOSIS — J329 Chronic sinusitis, unspecified: Secondary | ICD-10-CM | POA: Diagnosis not present

## 2012-05-20 DIAGNOSIS — I10 Essential (primary) hypertension: Secondary | ICD-10-CM | POA: Diagnosis not present

## 2012-05-24 ENCOUNTER — Ambulatory Visit
Admission: RE | Admit: 2012-05-24 | Discharge: 2012-05-24 | Disposition: A | Discharge status: Home / Self Care | Payer: Medicare Other | Source: Ambulatory Visit | Attending: Internal Medicine | Admitting: Internal Medicine

## 2012-05-24 DIAGNOSIS — R51 Headache: Secondary | ICD-10-CM | POA: Diagnosis not present

## 2012-05-24 DIAGNOSIS — R42 Dizziness and giddiness: Secondary | ICD-10-CM | POA: Diagnosis not present

## 2012-06-17 DIAGNOSIS — F329 Major depressive disorder, single episode, unspecified: Secondary | ICD-10-CM | POA: Diagnosis not present

## 2012-06-17 DIAGNOSIS — G4700 Insomnia, unspecified: Secondary | ICD-10-CM | POA: Diagnosis not present

## 2012-06-17 DIAGNOSIS — R5383 Other fatigue: Secondary | ICD-10-CM | POA: Diagnosis not present

## 2012-06-17 DIAGNOSIS — R5381 Other malaise: Secondary | ICD-10-CM | POA: Diagnosis not present

## 2012-06-17 DIAGNOSIS — I10 Essential (primary) hypertension: Secondary | ICD-10-CM | POA: Diagnosis not present

## 2012-07-08 ENCOUNTER — Encounter (HOSPITAL_COMMUNITY): Payer: Self-pay | Admitting: Emergency Medicine

## 2012-07-08 ENCOUNTER — Emergency Department (HOSPITAL_COMMUNITY)
Admission: EM | Admit: 2012-07-08 | Discharge: 2012-07-08 | Disposition: A | Discharge status: Home / Self Care | Payer: Medicare Other | Attending: Emergency Medicine | Admitting: Emergency Medicine

## 2012-07-08 ENCOUNTER — Emergency Department (HOSPITAL_COMMUNITY): Payer: Medicare Other

## 2012-07-08 DIAGNOSIS — Z79899 Other long term (current) drug therapy: Secondary | ICD-10-CM | POA: Insufficient documentation

## 2012-07-08 DIAGNOSIS — S8990XA Unspecified injury of unspecified lower leg, initial encounter: Secondary | ICD-10-CM | POA: Insufficient documentation

## 2012-07-08 DIAGNOSIS — S80811A Abrasion, right lower leg, initial encounter: Secondary | ICD-10-CM

## 2012-07-08 DIAGNOSIS — I10 Essential (primary) hypertension: Secondary | ICD-10-CM | POA: Insufficient documentation

## 2012-07-08 DIAGNOSIS — I252 Old myocardial infarction: Secondary | ICD-10-CM | POA: Insufficient documentation

## 2012-07-08 DIAGNOSIS — Y9389 Activity, other specified: Secondary | ICD-10-CM | POA: Insufficient documentation

## 2012-07-08 DIAGNOSIS — Z87891 Personal history of nicotine dependence: Secondary | ICD-10-CM | POA: Insufficient documentation

## 2012-07-08 DIAGNOSIS — Y92410 Unspecified street and highway as the place of occurrence of the external cause: Secondary | ICD-10-CM | POA: Insufficient documentation

## 2012-07-08 DIAGNOSIS — IMO0002 Reserved for concepts with insufficient information to code with codable children: Secondary | ICD-10-CM | POA: Diagnosis not present

## 2012-07-08 MED ORDER — TRAMADOL HCL 50 MG PO TABS: 50.0000 mg | ORAL_TABLET | Freq: Four times a day (QID) | ORAL | Status: DC | PRN

## 2012-07-08 MED ORDER — ACETAMINOPHEN 325 MG PO TABS
650.0000 mg | ORAL_TABLET | Freq: Once | ORAL | Status: AC
  Administered 2012-07-08: 650 mg via ORAL
  Filled 2012-07-08: qty 2

## 2012-07-08 NOTE — ED Provider Notes (Signed)
Medical screening examination/treatment/procedure(s) were performed by non-physician practitioner and as supervising physician I was immediately available for consultation/collaboration.  Hameed Kolar M Zenab Gronewold, MD 07/08/12 1913 
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
## 2012-07-08 NOTE — ED Notes (Signed)
Patient transported to X-ray 
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
## 2012-07-08 NOTE — ED Notes (Signed)
Patient returned from X-ray 

## 2012-07-08 NOTE — ED Notes (Signed)
MD at bedside. 

## 2012-07-24 DIAGNOSIS — R079 Chest pain, unspecified: Secondary | ICD-10-CM | POA: Diagnosis not present

## 2012-07-24 DIAGNOSIS — I10 Essential (primary) hypertension: Secondary | ICD-10-CM | POA: Diagnosis not present

## 2012-07-24 DIAGNOSIS — R002 Palpitations: Secondary | ICD-10-CM | POA: Diagnosis not present

## 2012-08-15 DIAGNOSIS — I10 Essential (primary) hypertension: Secondary | ICD-10-CM | POA: Diagnosis not present

## 2012-08-15 DIAGNOSIS — R079 Chest pain, unspecified: Secondary | ICD-10-CM | POA: Diagnosis not present

## 2012-09-02 DIAGNOSIS — I10 Essential (primary) hypertension: Secondary | ICD-10-CM | POA: Diagnosis not present

## 2012-09-02 DIAGNOSIS — R002 Palpitations: Secondary | ICD-10-CM | POA: Diagnosis not present

## 2012-10-01 DIAGNOSIS — I10 Essential (primary) hypertension: Secondary | ICD-10-CM | POA: Diagnosis not present

## 2012-10-29 DIAGNOSIS — Z0000 Encounter for general adult medical examination without abnormal findings: Secondary | ICD-10-CM | POA: Diagnosis not present

## 2012-10-29 DIAGNOSIS — Z125 Encounter for screening for malignant neoplasm of prostate: Secondary | ICD-10-CM | POA: Diagnosis not present

## 2012-10-29 DIAGNOSIS — R5381 Other malaise: Secondary | ICD-10-CM | POA: Diagnosis not present

## 2012-10-29 DIAGNOSIS — I10 Essential (primary) hypertension: Secondary | ICD-10-CM | POA: Diagnosis not present

## 2012-10-29 DIAGNOSIS — R5383 Other fatigue: Secondary | ICD-10-CM | POA: Diagnosis not present

## 2012-10-29 DIAGNOSIS — G4700 Insomnia, unspecified: Secondary | ICD-10-CM | POA: Diagnosis not present

## 2012-11-20 DIAGNOSIS — G478 Other sleep disorders: Secondary | ICD-10-CM | POA: Diagnosis not present

## 2012-11-20 DIAGNOSIS — I10 Essential (primary) hypertension: Secondary | ICD-10-CM | POA: Diagnosis not present

## 2012-11-20 DIAGNOSIS — F068 Other specified mental disorders due to known physiological condition: Secondary | ICD-10-CM | POA: Diagnosis not present

## 2013-05-15 DIAGNOSIS — I639 Cerebral infarction, unspecified: Secondary | ICD-10-CM

## 2013-05-15 HISTORY — DX: Cerebral infarction, unspecified: I63.9

## 2013-05-19 ENCOUNTER — Emergency Department (HOSPITAL_COMMUNITY): Payer: Medicare Other

## 2013-05-19 ENCOUNTER — Encounter (HOSPITAL_COMMUNITY): Payer: Self-pay | Admitting: Emergency Medicine

## 2013-05-19 ENCOUNTER — Emergency Department (HOSPITAL_COMMUNITY)
Admission: EM | Admit: 2013-05-19 | Discharge: 2013-05-19 | Disposition: A | Discharge status: Home / Self Care | Payer: Medicare Other | Attending: Emergency Medicine | Admitting: Emergency Medicine

## 2013-05-19 DIAGNOSIS — Z79899 Other long term (current) drug therapy: Secondary | ICD-10-CM | POA: Diagnosis not present

## 2013-05-19 DIAGNOSIS — R0789 Other chest pain: Secondary | ICD-10-CM | POA: Insufficient documentation

## 2013-05-19 DIAGNOSIS — I10 Essential (primary) hypertension: Secondary | ICD-10-CM | POA: Diagnosis not present

## 2013-05-19 DIAGNOSIS — R29818 Other symptoms and signs involving the nervous system: Secondary | ICD-10-CM | POA: Insufficient documentation

## 2013-05-19 DIAGNOSIS — Z87891 Personal history of nicotine dependence: Secondary | ICD-10-CM | POA: Diagnosis not present

## 2013-05-19 DIAGNOSIS — Z95818 Presence of other cardiac implants and grafts: Secondary | ICD-10-CM | POA: Diagnosis not present

## 2013-05-19 DIAGNOSIS — H04129 Dry eye syndrome of unspecified lacrimal gland: Secondary | ICD-10-CM | POA: Diagnosis not present

## 2013-05-19 DIAGNOSIS — R209 Unspecified disturbances of skin sensation: Secondary | ICD-10-CM | POA: Insufficient documentation

## 2013-05-19 DIAGNOSIS — Z8673 Personal history of transient ischemic attack (TIA), and cerebral infarction without residual deficits: Secondary | ICD-10-CM | POA: Insufficient documentation

## 2013-05-19 DIAGNOSIS — H2510 Age-related nuclear cataract, unspecified eye: Secondary | ICD-10-CM | POA: Diagnosis not present

## 2013-05-19 DIAGNOSIS — R404 Transient alteration of awareness: Secondary | ICD-10-CM | POA: Diagnosis not present

## 2013-05-19 DIAGNOSIS — I252 Old myocardial infarction: Secondary | ICD-10-CM | POA: Insufficient documentation

## 2013-05-19 DIAGNOSIS — R42 Dizziness and giddiness: Secondary | ICD-10-CM | POA: Diagnosis not present

## 2013-05-19 DIAGNOSIS — R51 Headache: Secondary | ICD-10-CM | POA: Insufficient documentation

## 2013-05-19 HISTORY — DX: Cerebral infarction, unspecified: I63.9

## 2013-05-19 LAB — POCT I-STAT, CHEM 8
BUN: 17 mg/dL (ref 6–23)
CALCIUM ION: 1.13 mmol/L (ref 1.13–1.30)
CHLORIDE: 102 meq/L (ref 96–112)
CREATININE: 1 mg/dL (ref 0.50–1.35)
GLUCOSE: 104 mg/dL — AB (ref 70–99)
HEMATOCRIT: 50 % (ref 39.0–52.0)
Hemoglobin: 17 g/dL (ref 13.0–17.0)
POTASSIUM: 6.2 meq/L — AB (ref 3.7–5.3)
Sodium: 139 mEq/L (ref 137–147)
TCO2: 31 mmol/L (ref 0–100)

## 2013-05-19 LAB — CBC WITH DIFFERENTIAL/PLATELET
Basophils Absolute: 0 10*3/uL (ref 0.0–0.1)
Basophils Relative: 0 % (ref 0–1)
Eosinophils Absolute: 0.2 10*3/uL (ref 0.0–0.7)
Eosinophils Relative: 3 % (ref 0–5)
HEMATOCRIT: 45.2 % (ref 39.0–52.0)
HEMOGLOBIN: 15.1 g/dL (ref 13.0–17.0)
LYMPHS ABS: 2.7 10*3/uL (ref 0.7–4.0)
LYMPHS PCT: 33 % (ref 12–46)
MCH: 26.7 pg (ref 26.0–34.0)
MCHC: 33.4 g/dL (ref 30.0–36.0)
MCV: 79.9 fL (ref 78.0–100.0)
MONO ABS: 0.7 10*3/uL (ref 0.1–1.0)
MONOS PCT: 9 % (ref 3–12)
NEUTROS ABS: 4.4 10*3/uL (ref 1.7–7.7)
NEUTROS PCT: 55 % (ref 43–77)
Platelets: 186 10*3/uL (ref 150–400)
RBC: 5.66 MIL/uL (ref 4.22–5.81)
RDW: 14.2 % (ref 11.5–15.5)
WBC: 8.1 10*3/uL (ref 4.0–10.5)

## 2013-05-19 LAB — POCT I-STAT TROPONIN I: Troponin i, poc: 0 ng/mL (ref 0.00–0.08)

## 2013-05-19 LAB — TROPONIN I

## 2013-05-19 LAB — POTASSIUM: POTASSIUM: 4.2 meq/L (ref 3.7–5.3)

## 2013-05-19 MED ORDER — LISINOPRIL 40 MG PO TABS
40.0000 mg | ORAL_TABLET | Freq: Once | ORAL | Status: DC
  Filled 2013-05-19: qty 1

## 2013-05-19 NOTE — ED Provider Notes (Signed)
CSN: [id number]     Arrival date & time 05/19/13  0021 History   First MD Initiated Contact with Patient 05/19/13 0038     Chief Complaint  Patient presents with  . Hypertension   (Consider location/radiation/quality/duration/timing/severity/associated sxs/prior Treatment) HPI Comments: Patient is 73 year old male with history of HTN, MI and CVA who presents to the ED with high blood pressure for a day - his daughter states that she got home from work today and the patient told her his blood pressure had been elevated all through the day despite his taking his medication.  He states that he took the medication at 6pm and then again at 8pm when he noted his blood pressure to be in the 401'U - 272'Z systolic.  Patient states that earlier in the day he had a left sided headache and dizziness associated with this as well as left sided chest pain with numbness to his left arm.  He reports that while the chest pain was on he felt short of breath but denies diaphoresis, nausea, vomiting, weakness on one side of his body, fever, chills.  His symptoms have resolved at this time.  Patient is a 73 y.o. male presenting with hypertension. The history is provided by the patient and a relative. The history is limited by a language barrier. A language interpreter was used (daughter acted as Astronomer).  Hypertension This is a chronic problem. The current episode started today. The problem occurs constantly. The problem has been unchanged. Associated symptoms include chest pain, numbness and vertigo. Pertinent negatives include no abdominal pain, arthralgias, chills, coughing, diaphoresis, fatigue, fever, headaches, myalgias, nausea, neck pain, rash, visual change, vomiting or weakness. Nothing aggravates the symptoms. He has tried nothing for the symptoms. The treatment provided no relief.    Past Medical History  Diagnosis Date  . Hypertension   . MI, old     x2  . CVA (cerebral infarction)    Past Surgical  History  Procedure Laterality Date  . Cardiac catheterization     No family history on file. History  Substance Use Topics  . Smoking status: Former Research scientist (life sciences)  . Smokeless tobacco: Not on file  . Alcohol Use: No    Review of Systems  Constitutional: Negative for fever, chills, diaphoresis and fatigue.  Respiratory: Negative for cough.   Cardiovascular: Positive for chest pain.  Gastrointestinal: Negative for nausea, vomiting and abdominal pain.  Musculoskeletal: Negative for arthralgias, myalgias and neck pain.  Skin: Negative for rash.  Neurological: Positive for vertigo and numbness. Negative for weakness and headaches.  All other systems reviewed and are negative.    Allergies  Review of patient's allergies indicates no known allergies.  Home Medications   Current Outpatient Rx  Name  Route  Sig  Dispense  Refill  . LOSARTAN POTASSIUM PO   Oral   Take by mouth.         Marland Kitchen lisinopril (PRINIVIL,ZESTRIL) 30 MG tablet   Oral   Take 30 mg by mouth daily.         Marland Kitchen omega-3 acid ethyl esters (LOVAZA) 1 G capsule   Oral   Take 1 g by mouth daily.          . traMADol (ULTRAM) 50 MG tablet   Oral   Take 1 tablet (50 mg total) by mouth every 6 (six) hours as needed for pain.   15 tablet   0    BP 193/73  Pulse 56  Temp(Src) 98 F (  36.7 C) (Oral)  Resp 18  SpO2 98% Physical Exam  Nursing note and vitals reviewed. Constitutional: He is oriented to person, place, and time. He appears well-developed and well-nourished. No distress.  HENT:  Head: Normocephalic and atraumatic.  Right Ear: External ear normal.  Left Ear: External ear normal.  Nose: Nose normal.  Mouth/Throat: Oropharynx is clear and moist. No oropharyngeal exudate.  Eyes: Conjunctivae and EOM are normal. Pupils are equal, round, and reactive to light. No scleral icterus.  Neck: Normal range of motion. Neck supple. No JVD present.  Cardiovascular: Normal rate, regular rhythm and normal heart  sounds.  Exam reveals no gallop and no friction rub.   No murmur heard. Pulmonary/Chest: Effort normal and breath sounds normal. No respiratory distress. He has no wheezes. He has no rales. He exhibits no tenderness.  Abdominal: Soft. Bowel sounds are normal. He exhibits no distension. There is no tenderness. There is no rebound and no guarding.  Musculoskeletal: Normal range of motion. He exhibits no edema and no tenderness.  Lymphadenopathy:    He has no cervical adenopathy.  Neurological: He is alert and oriented to person, place, and time. He has normal strength and normal reflexes. He displays no atrophy and no tremor. A sensory deficit is present. No cranial nerve deficit. He exhibits normal muscle tone. He displays a negative Romberg sign. Coordination and gait normal. GCS eye subscore is 4. GCS verbal subscore is 5. GCS motor subscore is 6.  Skin: Skin is warm and dry. No rash noted. No erythema. No pallor.  Psychiatric: He has a normal mood and affect. His behavior is normal. Judgment and thought content normal.    ED Course  Procedures (including critical care time) Labs Review Labs Reviewed  CBC WITH DIFFERENTIAL   Imaging Review No results found.  EKG Interpretation   None      Results for orders placed during the hospital encounter of 05/19/13  CBC WITH DIFFERENTIAL      Result Value Range   WBC 8.1  4.0 - 10.5 K/uL   RBC 5.66  4.22 - 5.81 MIL/uL   Hemoglobin 15.1  13.0 - 17.0 g/dL   HCT 45.2  39.0 - 52.0 %   MCV 79.9  78.0 - 100.0 fL   MCH 26.7  26.0 - 34.0 pg   MCHC 33.4  30.0 - 36.0 g/dL   RDW 14.2  11.5 - 15.5 %   Platelets 186  150 - 400 K/uL   Neutrophils Relative % 55  43 - 77 %   Neutro Abs 4.4  1.7 - 7.7 K/uL   Lymphocytes Relative 33  12 - 46 %   Lymphs Abs 2.7  0.7 - 4.0 K/uL   Monocytes Relative 9  3 - 12 %   Monocytes Absolute 0.7  0.1 - 1.0 K/uL   Eosinophils Relative 3  0 - 5 %   Eosinophils Absolute 0.2  0.0 - 0.7 K/uL   Basophils Relative 0   0 - 1 %   Basophils Absolute 0.0  0.0 - 0.1 K/uL  POTASSIUM      Result Value Range   Potassium 4.2  3.7 - 5.3 mEq/L  TROPONIN I      Result Value Range   Troponin I <0.30  <0.30 ng/mL  POCT I-STAT, CHEM 8      Result Value Range   Sodium 139  137 - 147 mEq/L   Potassium 6.2 (*) 3.7 - 5.3 mEq/L   Chloride 102  96 - 112 mEq/L   BUN 17  6 - 23 mg/dL   Creatinine, Ser 1.00  0.50 - 1.35 mg/dL   Glucose, Bld 104 (*) 70 - 99 mg/dL   Calcium, Ion 1.13  1.13 - 1.30 mmol/L   TCO2 31  0 - 100 mmol/L   Hemoglobin 17.0  13.0 - 17.0 g/dL   HCT 50.0  39.0 - 52.0 %  POCT I-STAT TROPONIN I      Result Value Range   Troponin i, poc 0.00  0.00 - 0.08 ng/mL   Comment 3            Dg Chest 2 View  05/19/2013   CLINICAL DATA:  Hypertension  EXAM: CHEST  2 VIEW  COMPARISON:  11/07/2011  FINDINGS: Lung volumes are lower than previous, likely accounting for increased density at the right base in the frontal project. Cardiomegaly, also accentuated by volumes. No edema or effusion. No pneumothorax.  IMPRESSION: No consolidation or edema.   Electronically Signed   By: Jorje Guild M.D.   On: 05/19/2013 02:30   Ct Head Wo Contrast  05/19/2013   CLINICAL DATA:  Hypertension and left arm numbness  EXAM: CT HEAD WITHOUT CONTRAST  TECHNIQUE: Contiguous axial images were obtained from the base of the skull through the vertex without intravenous contrast.  COMPARISON:  CT HEAD W/O CM dated 05/24/2012  FINDINGS: No acute intracranial abnormality is identified. Specifically, no hemorrhage, hydrocephalus, mass effect, mass lesion, or evidence of acute cortically based infarction. Chronic microvascular ischemic changes are stable. Skull is intact. The paranasal sinuses and mastoid air cells are clear.  IMPRESSION: No acute intracranial abnormality. Stable chronic small vessel ischemic changes.   Electronically Signed   By: Curlene Dolphin M.D.   On: 05/19/2013 02:27    5:26 AM Patient able to ambulate in the room, balance  normal, denies dizziness, denies chest pain or shortness of breath.  Repeat blood pressure 138/76 MDM  Dizziness Transient hypertension  Patient is 73 year old male who presents with dizziness, given these symptoms we have gotten a CT scan which is normal - patient's symptoms have eased.  Though this could be TIA, I doubt they would have resolved so quickly  He is normotensive at this point and two sets of troponins are negative.  I believe his symptoms to be more related to the hypertension.  He is noted with bradycardia on the several times I went into the room, I have recommended to his daughter they follow up with Dr. Tamsen Snider with cards on this as this could be part of the symptoms.  His heart rate never went below 50 here so I do not believe that urgent intervention is needed.  The patient and his daughter agree with this plan.   Idalia Needle Joelyn Oms, PA-C 05/19/13 0532

## 2013-05-19 NOTE — ED Notes (Signed)
PA and MD made aware of patients BP.  Ordered to hold lisinopril at this time.

## 2013-05-19 NOTE — ED Notes (Signed)
Used interpreter services [phone]  Pt reports around 6pm didn't feel well took bp it was high so took bp meds.  Tried to lay down around 2000 and felt like everything was spinning so took bp and was still high so took another bp med.  Then around 2300 tried to lay down again and felt the same and felt some heaviness in chest and bp was still high so i called EMS.  Pt denies chest heaviness at this time.  Denies SOB. Denies headache.  Pt reports dizziness subsided a little after 2300.

## 2013-05-19 NOTE — ED Notes (Signed)
Pt reports dizziness when laying down on bed and took BP and it was SBP165 and heart rate was low.  Denies chest pain, shortness of breath.  Complains of tingling and numb feeling to left arm.  Equal strength x 4 extremities.  A&O but does not speak english.  Pt denies headache at this time.

## 2013-05-19 NOTE — ED Provider Notes (Signed)
Medical screening examination/treatment/procedure(s) were performed by non-physician practitioner and as supervising physician I was immediately available for consultation/collaboration.  EKG Interpretation    Date/Time:  Monday May 19 2013 00:27:55 EST Ventricular Rate:  51 PR Interval:  140 QRS Duration: 99 QT Interval:  445 QTC Calculation: 410 R Axis:   -28 Text Interpretation:  Sinus rhythm Borderline left axis deviation Probable anteroseptal infarct, old No significant change since last tracing Confirmed by Raider Valbuena  MD, Julieanne Hadsall ([id number]) on 05/19/2013 5:07:28 PM              Merryl Hacker, MD 05/19/13 [phone]

## 2013-05-19 NOTE — ED Notes (Signed)
Bed: WA01 Expected date:  Expected time:  Means of arrival:  Comments: EMS dizziness, HTN

## 2013-05-19 NOTE — Discharge Instructions (Signed)
Chng m?t (Dizziness) Chng m?t l m?t v?n ?? ph? bi?n. ? l c?m gic khng ?n ??nh ho?c chng m?t. B?n c th? c?m th?y nh? s?p ng?t. Chng m?t c th? d?n ??n ch?n th??ng n?u b?n v?p ho?c t ng. B?t k? ?? tu?i no c?ng c th? b? chng m?t, nh?ng chng m?t ph? bi?n h?n ? ng??i l?n tu?i.  NGUYN NHN Chng m?t c th? gy b?i nhi?u nguyn nhn khc nhau, bao g?m:  V?n ?? tai gi?a.  ??ng qu lu.  Nhi?m trng.  Ph?n ?ng d? ?ng.  Lo ha.  M?t ph?n ?ng c?m xc v?i m?t ci g ?, ch?ng h?n nh? khi nhn th?y mu.  Tc d?ng ph? c?a thu?c.  M?t m?i.  V?n ?? v?i tu?n hon ho?c huy?t p.  S? d?ng qu nhi?u r??u, thu?c ho?c ma ty b?t h?p php.  Th? qu nhanh (t?ng thng kh).  R?i lo?n nh?p ho?c cc v?n ?? v? nh?p tim.  S? l??ng h?ng c?u th?p (thi?u mu).  [place]ang [place].  Nn m?a, tiu ch?y, s?t ho?c cc b?nh khc gy ra m?t n??c.  B?nh ho?c tnh tr?ng b?nh l nh? b?nh Parkinson, huy?t p cao (t?ng huy?t p), ti?u ???ng v cc v?n ?? v? tuy?n gip.  Ti?p xc v?i nhi?t ?? qu m?c. CH?N ?ON ?? tm nguyn nhn gy chng m?t, chuyn gia ch?m [place] s?c kh?e c th? khm th?c th?, xt nghi?m, ch?p X-quang ho?c ki?m tra ?i?n tm ?? (ECG). ?I?U TR? ?i?u tr? chng m?t ph? thu?c vo nguyn nhn gy ra tri?u ch?ng v c th? khc nhau r?t nhi?u. H??NG D?N CH?M [employer] T?I NH  U?ng ?? n??c ?? gi? cho n??c ti?u trong ho?c vng nh?t. ?i?u ny ??c bi?t quan tr?ng khi th?i ti?t r?t nng. ? ng??i gi, ?i?u ? c?ng quan tr?ng khi th?i ti?t l?nh.  N?u b?n b? chng m?t do thu?c, hy s? d?ng chng chnh xc theo ch? d?n. Khi s? d?ng thu?c tr? cao huy?t p, ?i?u ??c bi?t quan tr?ng l ??ng ln t? t?.  ??ng ln t? t? kh?i gh? v gi? v?ng cho ??n khi b?n c?m th?y khng c v?n ?? g.  Vo bu?i sng, ??u tin ng?i d?y bn c?nh gi??ng. Khi b?n c?m th?y ?n ? t? th? ny, hy ??ng ch?m d?y trong khi bm l?y m?t ci g ? cho ??n khi b?n th?y th?ng b?ng t?t.  N?u b?n c?n ??ng ? m?t n?i trong m?t th?i gian di,  hy nh? di chuy?n chn th??ng xuyn. Th?t ch?t v th? gin cc c? b?p ? chn b?n khi ??ng.  N?u chng m?t ti?p t?c l m?t v?n ??, b?n c?n ? cng v?i ai ? trong m?t ho?c hai ngy. Th?c hi?n ?i?u ny cho ??n khi b?n c?m th?y mnh ?? kh?e ?? ? m?t mnh. Yu c?u ng??i ny g?i cho chuyn gia ch?m [place] s?c kh?e c?a b?n n?u h? nh?n nh?ng thay ??i trong b?n l v?n ?? c?n quan tm.  Khng li xe ho?c v?n hnh my mc n?ng n?u b?n c?m th?y chng m?t.  Khng u?ng r??u. HY NGAY L?P T?C ?I KHM N?U:  B?n b? chng m?t ho?c chng m?t n?ng h?n.  B?n c?m th?y bu?n nn ho?c nn.  B?n c v?n ?? khi ni chuy?n, ?i b?, y?u ho?c khi s? d?ng cnh tay, bn tay hay chn.  B?n suy ngh? khng r rng ho?c b?n g?p kh kh?n khi ??t cu. C th?  c?n nh? m?t ng??i b?n ho?c thnh vin gia ?nh xc ??nh xem suy ngh? c?a b?n c bnh th??ng khng.  B?n b? ?au ng?c, ?au b?ng, kh th? ho?c v m? hi.  Th? l?c c?a b?n thay ??i.  B?n nh?n th?y b?t k? ch? ch?y mu no.  B?n b? tc d?ng ph? t? thu?c c v? nh? tr? nn t?i t? h?n ch? khng ph?i l ?? h?n. ??M B?O B?N:  Hi?u cc h??ng d?n ny.  S? theo di tnh tr?ng c?a mnh.  S? yu c?u tr? gip ngay l?p t?c n?u b?n c?m th?y khng ?? ho?c tnh tr?ng tr?m tr?ng h?n. Document Released: 04/20/2011 Document Revised: 01/01/2013 [facility] Patient Information 2014 [place], [place].  T?ng Huy?t p (Hypertension) Khi tim ??p, n ??y mu qua cc ??ng m?ch. L?c ??y ny ???c g?i l huy?t p. N?u huy?t p qu cao, ng??i ta g?i ? l ch?ng t?ng huy?t p (HTN) hay cao huy?t p. Ch?ng t?ng huy?t p r?t nguy hi?m v b?n c th? m?c ph?i m khng hay bi?t g. Huy?t p cao c ngh?a l tim c?a b?n ph?i lm vi?c nhi?u h?n ?? b?m mu. Cc ??ng m?ch c th? b? h?p ho?c x? c?ng. T?ng cng cho tim lm b?n c nguy c? m?c b?nh tim, ??t qu?, v cc v?n ?? khc.  Huy?t p bao g?m hai con s?, s? cao h?n trn s? th?p h?n, v d?: 110/72. Huy?t p ???c ghi l "110 trn 72". L t??ng l d??i 120 cho s? trn  (tm thu) v d??i 80 cho s? d??i (tm tr??ng). Ghi huy?t p c?a b?n ngy hm nay. B?n nn h?t s?c ch  ??n huy?t p c?a mnh n?u ?ang m?c ph?i nh?ng c?n b?nh nh?t ??ng nh? l:  Suy tim  Tr??c ?y b? nh?i mu c? tim  Ti?u ???ng  B?nh th?n m?n tnh  Tr??c ?y b? ??t qu?  Nhi?u nguy c? m?c b?nh tim. ?? xem c b? m?c ch?ng t?ng huy?t p hay khng, b?n nn ?o huy?t p khi ?ang ng?i v?i cnh tay ???c ??t ngang t?m tim c?a b?n. Nn ?o huy?t p t nh?t l hai l?n. ??c s? huy?t p cao m?t l?n (??c bi?t l ? [place] C?p C?u) khng c ngh?a l b?n c?n ph?i ???c ?i?u tr?. C th? c cc c?n b?nh c huy?t p khc nhau gi?a tay tri v tay ph?i. ?i?u quan tr?ng l ph?i ?i khm s?m ?? chuyn gia ch?m [place] s?c kh?e ki?m tra l?i. ?a s? m?i ng??i m?c ph?i ch?ng t?ng huy?t p nguyn pht, ?i?u ny c ngh?a l khng c nguyn nhn c? th?. C th? lm gi?m d?ng cao huy?t p ny b?ng cch thay ??i cc y?u t? v? l?i s?ng nh? l:  C?ng th?ng  Ht thu?c  Khng t?p th? d?c  Th?a cn  S? d?ng ma ty/thu?c l/r??u  Ch? ?? ?n t mu?i ?a s? ng??i ta khng c cc tri?u ch?ng do cao huy?t p cho ??n khi b?nh gy t?n h?i cho c? th?. Vi?c ?i?u tr? hi?u qu? th??ng c th? ng?n ch?n, tr hon hay gi?m m?c ?? t?n h?i ?. ?I?U TR? Khi ? xc ??nh ???c nguyn nhn, ?i?u tr? t?ng huy?t p s? nh?m vo nguyn nhn. C r?t nhi?u thu?c ?i?u tr? ch?ng t?ng huy?t p. Cc thu?c ny c th? ???c chia ra thnh vi lo?i, v chuyn gia ch?m [place] s?c kh?e s? gip b?n ch?n cc lo?i thu?c t?t  nh?t cho b?n. Thu?c c th? c cc tc d?ng ph?. B?n v chuyn gia ch?m [place] s?c kh?e nn xem l?i nh?ng tc d?ng ph? ?. N?u huy?t p v?n cao sau khi b?n ? thay ??i l?i s?ng ho?c b?t ??u dng thu?c th,  C th? c?n ph?i thay ??i (cc) thu?c.  C th? c?n ph?i gi?i quy?t ??n nh?ng v?n ?? khc.  Ph?i ch?c ch?n r?ng b?n hi?u toa thu?c, v bi?t r khi no dng thu?c v dng thu?c nh? th? no.  Ph?i ch?c ch?n r?ng b?n g?p chuyn gia ch?m [place] s?c kh?e ?? ti  khm trong khung th?i gian ? ???c khuy?n co (th??ng l trong vng hai tu?n) ?? ki?m tra l?i huy?t p v xem l?i thu?c.  N?u dng nhi?u lo?i thu?c ?? h? huy?t p, ph?i ch?c ch?n r?ng b?n bi?t khi no nn dng thu?c v dng nh? th? no. Dng cng m?t lc hai lo?i thu?c c th? d?n ??n huy?t p h? qu th?p. ?I KHM NGAY L?P T?C N?U:  B?n b? nh?c ??u d? d?i, th? l?c thay ??i hay b? m?, ho?c l l?n.  B?n b? y?u ho?c t b?t th??ng, ho?c c c?m gic ng?t.  ?au b?ng hay ng?c tr?m tr?ng, i m?a, ho?c kh th?. HY CH?C CH?N R?NG B?N:  Hi?u nh?ng ch? d?n ny.  S? theo di tnh tr?ng c?a b?n.  S? nh?n ???c s? gip ?? ngay l?p t?c n?u b?n khng ?? ho?c tnh tr?ng tr?m tr?ng h?n. Document Released: 05/01/2005 Document Revised: 01/01/2013 [facility] Patient Information 2014 [place], [place].

## 2013-05-19 NOTE — ED Notes (Signed)
EMS reports patient has hx of stroke and bp has been high all day.  Patient doesn't speak english and daughter just got home from work and patient told her.  Denies any symptoms.

## 2013-05-20 DIAGNOSIS — I10 Essential (primary) hypertension: Secondary | ICD-10-CM | POA: Diagnosis not present

## 2013-05-20 DIAGNOSIS — G478 Other sleep disorders: Secondary | ICD-10-CM | POA: Diagnosis not present

## 2013-05-20 DIAGNOSIS — F3289 Other specified depressive episodes: Secondary | ICD-10-CM | POA: Diagnosis not present

## 2013-05-20 DIAGNOSIS — G4700 Insomnia, unspecified: Secondary | ICD-10-CM | POA: Diagnosis not present

## 2013-05-20 DIAGNOSIS — F329 Major depressive disorder, single episode, unspecified: Secondary | ICD-10-CM | POA: Diagnosis not present

## 2013-06-03 DIAGNOSIS — I10 Essential (primary) hypertension: Secondary | ICD-10-CM | POA: Diagnosis not present

## 2013-06-03 DIAGNOSIS — G4700 Insomnia, unspecified: Secondary | ICD-10-CM | POA: Diagnosis not present

## 2013-06-03 DIAGNOSIS — F329 Major depressive disorder, single episode, unspecified: Secondary | ICD-10-CM | POA: Diagnosis not present

## 2013-06-03 DIAGNOSIS — F3289 Other specified depressive episodes: Secondary | ICD-10-CM | POA: Diagnosis not present

## 2013-06-05 DIAGNOSIS — R079 Chest pain, unspecified: Secondary | ICD-10-CM | POA: Diagnosis not present

## 2013-06-05 DIAGNOSIS — I10 Essential (primary) hypertension: Secondary | ICD-10-CM | POA: Diagnosis not present

## 2013-06-05 DIAGNOSIS — R0609 Other forms of dyspnea: Secondary | ICD-10-CM | POA: Diagnosis not present

## 2013-06-12 DIAGNOSIS — F341 Dysthymic disorder: Secondary | ICD-10-CM | POA: Diagnosis not present

## 2013-06-12 DIAGNOSIS — F4310 Post-traumatic stress disorder, unspecified: Secondary | ICD-10-CM | POA: Diagnosis not present

## 2013-06-24 DIAGNOSIS — F341 Dysthymic disorder: Secondary | ICD-10-CM | POA: Diagnosis not present

## 2013-06-24 DIAGNOSIS — F4310 Post-traumatic stress disorder, unspecified: Secondary | ICD-10-CM | POA: Diagnosis not present

## 2013-08-04 DIAGNOSIS — I10 Essential (primary) hypertension: Secondary | ICD-10-CM | POA: Diagnosis not present

## 2013-08-26 DIAGNOSIS — F341 Dysthymic disorder: Secondary | ICD-10-CM | POA: Diagnosis not present

## 2013-08-26 DIAGNOSIS — F4310 Post-traumatic stress disorder, unspecified: Secondary | ICD-10-CM | POA: Diagnosis not present

## 2013-09-29 DIAGNOSIS — F341 Dysthymic disorder: Secondary | ICD-10-CM | POA: Diagnosis not present

## 2013-09-29 DIAGNOSIS — F4310 Post-traumatic stress disorder, unspecified: Secondary | ICD-10-CM | POA: Diagnosis not present

## 2013-11-06 DIAGNOSIS — F341 Dysthymic disorder: Secondary | ICD-10-CM | POA: Diagnosis not present

## 2013-11-06 DIAGNOSIS — F4310 Post-traumatic stress disorder, unspecified: Secondary | ICD-10-CM | POA: Diagnosis not present

## 2013-12-04 DIAGNOSIS — F4310 Post-traumatic stress disorder, unspecified: Secondary | ICD-10-CM | POA: Diagnosis not present

## 2013-12-04 DIAGNOSIS — F341 Dysthymic disorder: Secondary | ICD-10-CM | POA: Diagnosis not present

## 2013-12-10 DIAGNOSIS — Z1331 Encounter for screening for depression: Secondary | ICD-10-CM | POA: Diagnosis not present

## 2013-12-10 DIAGNOSIS — R7309 Other abnormal glucose: Secondary | ICD-10-CM | POA: Diagnosis not present

## 2013-12-10 DIAGNOSIS — Z0000 Encounter for general adult medical examination without abnormal findings: Secondary | ICD-10-CM | POA: Diagnosis not present

## 2013-12-10 DIAGNOSIS — I10 Essential (primary) hypertension: Secondary | ICD-10-CM | POA: Diagnosis not present

## 2013-12-15 DIAGNOSIS — I10 Essential (primary) hypertension: Secondary | ICD-10-CM | POA: Diagnosis not present

## 2013-12-17 DIAGNOSIS — I10 Essential (primary) hypertension: Secondary | ICD-10-CM | POA: Diagnosis not present

## 2013-12-17 DIAGNOSIS — F329 Major depressive disorder, single episode, unspecified: Secondary | ICD-10-CM | POA: Diagnosis not present

## 2013-12-17 DIAGNOSIS — F068 Other specified mental disorders due to known physiological condition: Secondary | ICD-10-CM | POA: Diagnosis not present

## 2013-12-17 DIAGNOSIS — G478 Other sleep disorders: Secondary | ICD-10-CM | POA: Diagnosis not present

## 2013-12-17 DIAGNOSIS — F3289 Other specified depressive episodes: Secondary | ICD-10-CM | POA: Diagnosis not present

## 2014-01-02 DIAGNOSIS — F4310 Post-traumatic stress disorder, unspecified: Secondary | ICD-10-CM | POA: Diagnosis not present

## 2014-01-08 DIAGNOSIS — R7309 Other abnormal glucose: Secondary | ICD-10-CM | POA: Diagnosis not present

## 2014-01-08 DIAGNOSIS — E785 Hyperlipidemia, unspecified: Secondary | ICD-10-CM | POA: Diagnosis not present

## 2014-01-08 DIAGNOSIS — R079 Chest pain, unspecified: Secondary | ICD-10-CM | POA: Diagnosis not present

## 2014-01-08 DIAGNOSIS — I10 Essential (primary) hypertension: Secondary | ICD-10-CM | POA: Diagnosis not present

## 2014-02-18 DIAGNOSIS — Z23 Encounter for immunization: Secondary | ICD-10-CM | POA: Diagnosis not present

## 2014-03-03 DIAGNOSIS — F341 Dysthymic disorder: Secondary | ICD-10-CM | POA: Diagnosis not present

## 2014-03-03 DIAGNOSIS — F4312 Post-traumatic stress disorder, chronic: Secondary | ICD-10-CM | POA: Diagnosis not present

## 2014-05-15 HISTORY — PX: COLONOSCOPY: SHX174

## 2014-08-31 DIAGNOSIS — F341 Dysthymic disorder: Secondary | ICD-10-CM | POA: Diagnosis not present

## 2014-08-31 DIAGNOSIS — F4312 Post-traumatic stress disorder, chronic: Secondary | ICD-10-CM | POA: Diagnosis not present

## 2014-09-03 DIAGNOSIS — H2513 Age-related nuclear cataract, bilateral: Secondary | ICD-10-CM | POA: Diagnosis not present

## 2014-09-08 DIAGNOSIS — N400 Enlarged prostate without lower urinary tract symptoms: Secondary | ICD-10-CM | POA: Diagnosis not present

## 2014-09-08 DIAGNOSIS — F5101 Primary insomnia: Secondary | ICD-10-CM | POA: Diagnosis not present

## 2014-09-08 DIAGNOSIS — I10 Essential (primary) hypertension: Secondary | ICD-10-CM | POA: Diagnosis not present

## 2014-09-11 DIAGNOSIS — I10 Essential (primary) hypertension: Secondary | ICD-10-CM | POA: Diagnosis not present

## 2014-09-11 DIAGNOSIS — E780 Pure hypercholesterolemia: Secondary | ICD-10-CM | POA: Diagnosis not present

## 2014-09-11 DIAGNOSIS — R739 Hyperglycemia, unspecified: Secondary | ICD-10-CM | POA: Diagnosis not present

## 2014-09-30 DIAGNOSIS — H2511 Age-related nuclear cataract, right eye: Secondary | ICD-10-CM | POA: Diagnosis not present

## 2014-10-07 DIAGNOSIS — H2511 Age-related nuclear cataract, right eye: Secondary | ICD-10-CM | POA: Diagnosis not present

## 2014-11-04 DIAGNOSIS — H2512 Age-related nuclear cataract, left eye: Secondary | ICD-10-CM | POA: Diagnosis not present
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
## 2014-11-20 DIAGNOSIS — I10 Essential (primary) hypertension: Secondary | ICD-10-CM | POA: Diagnosis not present

## 2014-12-29 DIAGNOSIS — F4312 Post-traumatic stress disorder, chronic: Secondary | ICD-10-CM | POA: Diagnosis not present

## 2014-12-29 DIAGNOSIS — F341 Dysthymic disorder: Secondary | ICD-10-CM | POA: Diagnosis not present

## 2015-01-01 DIAGNOSIS — I10 Essential (primary) hypertension: Secondary | ICD-10-CM | POA: Diagnosis not present

## 2015-01-16 DIAGNOSIS — Z23 Encounter for immunization: Secondary | ICD-10-CM | POA: Diagnosis not present

## 2015-01-19 DIAGNOSIS — F5101 Primary insomnia: Secondary | ICD-10-CM | POA: Diagnosis not present

## 2015-01-19 DIAGNOSIS — F0390 Unspecified dementia without behavioral disturbance: Secondary | ICD-10-CM | POA: Diagnosis not present

## 2015-01-19 DIAGNOSIS — Z125 Encounter for screening for malignant neoplasm of prostate: Secondary | ICD-10-CM | POA: Diagnosis not present

## 2015-01-19 DIAGNOSIS — Z1389 Encounter for screening for other disorder: Secondary | ICD-10-CM | POA: Diagnosis not present

## 2015-01-19 DIAGNOSIS — F514 Sleep terrors [night terrors]: Secondary | ICD-10-CM | POA: Diagnosis not present

## 2015-01-19 DIAGNOSIS — F331 Major depressive disorder, recurrent, moderate: Secondary | ICD-10-CM | POA: Diagnosis not present

## 2015-01-19 DIAGNOSIS — I10 Essential (primary) hypertension: Secondary | ICD-10-CM | POA: Diagnosis not present

## 2015-01-26 DIAGNOSIS — R5382 Chronic fatigue, unspecified: Secondary | ICD-10-CM | POA: Diagnosis not present

## 2015-01-26 DIAGNOSIS — F0390 Unspecified dementia without behavioral disturbance: Secondary | ICD-10-CM | POA: Diagnosis not present

## 2015-01-26 DIAGNOSIS — N401 Enlarged prostate with lower urinary tract symptoms: Secondary | ICD-10-CM | POA: Diagnosis not present

## 2015-01-26 DIAGNOSIS — I10 Essential (primary) hypertension: Secondary | ICD-10-CM | POA: Diagnosis not present

## 2015-02-08 DIAGNOSIS — Z1211 Encounter for screening for malignant neoplasm of colon: Secondary | ICD-10-CM | POA: Diagnosis not present

## 2015-02-08 DIAGNOSIS — R079 Chest pain, unspecified: Secondary | ICD-10-CM | POA: Diagnosis not present

## 2015-02-08 DIAGNOSIS — I10 Essential (primary) hypertension: Secondary | ICD-10-CM | POA: Diagnosis not present

## 2015-03-02 DIAGNOSIS — Z1211 Encounter for screening for malignant neoplasm of colon: Secondary | ICD-10-CM | POA: Diagnosis not present

## 2015-03-02 DIAGNOSIS — D124 Benign neoplasm of descending colon: Secondary | ICD-10-CM | POA: Diagnosis not present

## 2015-03-02 DIAGNOSIS — K635 Polyp of colon: Secondary | ICD-10-CM | POA: Diagnosis not present

## 2015-03-02 DIAGNOSIS — D122 Benign neoplasm of ascending colon: Secondary | ICD-10-CM | POA: Diagnosis not present

## 2015-03-18 ENCOUNTER — Encounter: Payer: Self-pay | Admitting: Urology

## 2015-03-18 DIAGNOSIS — R351 Nocturia: Secondary | ICD-10-CM | POA: Diagnosis not present

## 2015-03-18 DIAGNOSIS — R3912 Poor urinary stream: Secondary | ICD-10-CM | POA: Diagnosis not present

## 2015-03-18 DIAGNOSIS — N401 Enlarged prostate with lower urinary tract symptoms: Secondary | ICD-10-CM | POA: Diagnosis not present

## 2015-04-01 DIAGNOSIS — Z961 Presence of intraocular lens: Secondary | ICD-10-CM | POA: Diagnosis not present

## 2015-04-27 DIAGNOSIS — N401 Enlarged prostate with lower urinary tract symptoms: Secondary | ICD-10-CM | POA: Diagnosis not present

## 2015-04-27 DIAGNOSIS — R3912 Poor urinary stream: Secondary | ICD-10-CM | POA: Diagnosis not present

## 2015-04-27 DIAGNOSIS — N138 Other obstructive and reflux uropathy: Secondary | ICD-10-CM | POA: Diagnosis not present

## 2015-04-27 DIAGNOSIS — R351 Nocturia: Secondary | ICD-10-CM | POA: Diagnosis not present

## 2015-05-20 DIAGNOSIS — Z0000 Encounter for general adult medical examination without abnormal findings: Secondary | ICD-10-CM | POA: Diagnosis not present

## 2015-05-20 DIAGNOSIS — R3912 Poor urinary stream: Secondary | ICD-10-CM | POA: Diagnosis not present

## 2015-05-20 DIAGNOSIS — N138 Other obstructive and reflux uropathy: Secondary | ICD-10-CM | POA: Diagnosis not present

## 2015-05-20 DIAGNOSIS — R351 Nocturia: Secondary | ICD-10-CM | POA: Diagnosis not present

## 2015-05-20 DIAGNOSIS — N401 Enlarged prostate with lower urinary tract symptoms: Secondary | ICD-10-CM | POA: Diagnosis not present

## 2015-10-19 DIAGNOSIS — R079 Chest pain, unspecified: Secondary | ICD-10-CM | POA: Diagnosis not present

## 2015-10-19 DIAGNOSIS — R002 Palpitations: Secondary | ICD-10-CM | POA: Diagnosis not present

## 2015-10-19 DIAGNOSIS — I10 Essential (primary) hypertension: Secondary | ICD-10-CM | POA: Diagnosis not present

## 2016-01-19 DIAGNOSIS — N401 Enlarged prostate with lower urinary tract symptoms: Secondary | ICD-10-CM | POA: Diagnosis not present

## 2016-01-19 DIAGNOSIS — R3912 Poor urinary stream: Secondary | ICD-10-CM | POA: Diagnosis not present

## 2016-01-28 DIAGNOSIS — Z23 Encounter for immunization: Secondary | ICD-10-CM | POA: Diagnosis not present

## 2016-02-22 DIAGNOSIS — R5382 Chronic fatigue, unspecified: Secondary | ICD-10-CM | POA: Diagnosis not present

## 2016-02-22 DIAGNOSIS — I10 Essential (primary) hypertension: Secondary | ICD-10-CM | POA: Diagnosis not present

## 2016-02-22 DIAGNOSIS — Z125 Encounter for screening for malignant neoplasm of prostate: Secondary | ICD-10-CM | POA: Diagnosis not present

## 2016-02-22 DIAGNOSIS — F0390 Unspecified dementia without behavioral disturbance: Secondary | ICD-10-CM | POA: Diagnosis not present

## 2016-02-22 DIAGNOSIS — Z0000 Encounter for general adult medical examination without abnormal findings: Secondary | ICD-10-CM | POA: Diagnosis not present

## 2016-02-22 DIAGNOSIS — F331 Major depressive disorder, recurrent, moderate: Secondary | ICD-10-CM | POA: Diagnosis not present

## 2016-02-29 DIAGNOSIS — I10 Essential (primary) hypertension: Secondary | ICD-10-CM | POA: Diagnosis not present

## 2016-02-29 DIAGNOSIS — Z0000 Encounter for general adult medical examination without abnormal findings: Secondary | ICD-10-CM | POA: Diagnosis not present

## 2016-02-29 DIAGNOSIS — N401 Enlarged prostate with lower urinary tract symptoms: Secondary | ICD-10-CM | POA: Diagnosis not present

## 2016-05-22 ENCOUNTER — Other Ambulatory Visit: Payer: Self-pay | Admitting: Urology

## 2016-05-22 DIAGNOSIS — N401 Enlarged prostate with lower urinary tract symptoms: Secondary | ICD-10-CM | POA: Diagnosis not present

## 2016-05-22 DIAGNOSIS — R3912 Poor urinary stream: Secondary | ICD-10-CM | POA: Diagnosis not present

## 2016-05-22 DIAGNOSIS — R351 Nocturia: Secondary | ICD-10-CM | POA: Diagnosis not present

## 2016-06-01 ENCOUNTER — Encounter (HOSPITAL_COMMUNITY): Payer: Self-pay

## 2016-06-01 NOTE — Progress Notes (Signed)
Interpreter confirmed for day of surgery 06-06-16, [facility] short stay

## 2016-06-01 NOTE — Patient Instructions (Addendum)
OSCEOLA KOE  06/01/2016   Your procedure is scheduled on: 06-06-16  Report to [facility] Main  Entrance take [facility]  elevators to 3rd floor to  [place] at 0530AM.  Call this number if you have problems the morning of surgery [phone]   Remember: ONLY 1 PERSON MAY GO WITH YOU TO SHORT STAY TO GET  READY MORNING OF [place].  Do not eat food or drink liquids :After Midnight.     Take these medicines the morning of surgery with A SIP OF WATER: Alfusozin(Uroxatral), Atenolol(Tenormin) as needed,  Nifedepine(Procardia XL)                                 You may not have any metal on your body including hair pins and              piercings  Do not wear jewelry, make-up, lotions, powders or perfumes, deodorant             Do not wear nail polish.  Do not shave  48 hours prior to surgery.              Men may shave face and neck.   Do not bring valuables to the hospital. [place].  Contacts, dentures or bridgework may not be worn into surgery.  Leave suitcase in the car. After surgery it may be brought to your room.               Please read over the following fact sheets you were given: _____________________________________________________________________             [facility] - Preparing for Surgery Before surgery, you can play an important role.  Because skin is not sterile, your skin needs to be as free of germs as possible.  You can reduce the number of germs on your skin by washing with CHG (chlorahexidine gluconate) soap before surgery.  CHG is an antiseptic cleaner which kills germs and bonds with the skin to continue killing germs even after washing. Please DO NOT use if you have an allergy to CHG or antibacterial soaps.  If your skin becomes reddened/irritated stop using the CHG and inform your nurse when you arrive at Short Stay. Do not shave (including legs and underarms) for at least 48  hours prior to the first CHG shower.  You may shave your face/neck. Please follow these instructions carefully:  1.  Shower with CHG Soap the night before surgery and the  morning of Surgery.  2.  If you choose to wash your hair, wash your hair first as usual with your  normal  shampoo.  3.  After you shampoo, rinse your hair and body thoroughly to remove the  shampoo.                           4.  Use CHG as you would any other liquid soap.  You can apply chg directly  to the skin and wash                       Gently with a scrungie or clean washcloth.  5.  Apply  the CHG Soap to your body ONLY FROM THE NECK DOWN.   Do not use on face/ open                           Wound or open sores. Avoid contact with eyes, ears mouth and genitals (private parts).                       Wash face,  Genitals (private parts) with your normal soap.             6.  Wash thoroughly, paying special attention to the area where your surgery  will be performed.  7.  Thoroughly rinse your body with warm water from the neck down.  8.  DO NOT shower/wash with your normal soap after using and rinsing off  the CHG Soap.                9.  Pat yourself dry with a clean towel.            10.  Wear clean pajamas.            11.  Place clean sheets on your bed the night of your first shower and do not  sleep with pets. Day of Surgery : Do not apply any lotions/deodorants the morning of surgery.  Please wear clean clothes to the hospital/surgery center.  FAILURE TO FOLLOW THESE INSTRUCTIONS MAY RESULT IN THE CANCELLATION OF YOUR SURGERY PATIENT SIGNATURE_________________________________  NURSE SIGNATURE__________________________________  ________________________________________________________________________

## 2016-06-02 ENCOUNTER — Encounter (HOSPITAL_COMMUNITY)
Admission: RE | Admit: 2016-06-02 | Discharge: 2016-06-02 | Disposition: A | Discharge status: Home / Self Care | Payer: Medicare Other | Source: Ambulatory Visit | Attending: Urology | Admitting: Urology

## 2016-06-02 ENCOUNTER — Encounter (INDEPENDENT_AMBULATORY_CARE_PROVIDER_SITE_OTHER): Payer: Self-pay

## 2016-06-02 ENCOUNTER — Encounter (HOSPITAL_COMMUNITY): Payer: Self-pay

## 2016-06-02 DIAGNOSIS — Z01812 Encounter for preprocedural laboratory examination: Secondary | ICD-10-CM | POA: Insufficient documentation

## 2016-06-02 DIAGNOSIS — Z01810 Encounter for preprocedural cardiovascular examination: Secondary | ICD-10-CM | POA: Insufficient documentation

## 2016-06-02 HISTORY — DX: Depression, unspecified: F32.A

## 2016-06-02 HISTORY — DX: Benign prostatic hyperplasia without lower urinary tract symptoms: N40.0

## 2016-06-02 HISTORY — DX: Pneumonia, unspecified organism: J18.9

## 2016-06-02 HISTORY — DX: Cerebral infarction, unspecified: I63.9

## 2016-06-02 HISTORY — DX: Major depressive disorder, single episode, unspecified: F32.9

## 2016-06-02 HISTORY — DX: Headache, unspecified: R51.9

## 2016-06-02 HISTORY — DX: Headache: R51

## 2016-06-02 HISTORY — DX: Angina pectoris, unspecified: I20.9

## 2016-06-02 LAB — CBC
HCT: 37 % — ABNORMAL LOW (ref 39.0–52.0)
Hemoglobin: 12.1 g/dL — ABNORMAL LOW (ref 13.0–17.0)
MCH: 25.4 pg — AB (ref 26.0–34.0)
MCHC: 32.7 g/dL (ref 30.0–36.0)
MCV: 77.7 fL — AB (ref 78.0–100.0)
PLATELETS: 220 10*3/uL (ref 150–400)
RBC: 4.76 MIL/uL (ref 4.22–5.81)
RDW: 14.9 % (ref 11.5–15.5)
WBC: 8.3 10*3/uL (ref 4.0–10.5)

## 2016-06-02 LAB — BASIC METABOLIC PANEL
Anion gap: 5 (ref 5–15)
BUN: 22 mg/dL — ABNORMAL HIGH (ref 6–20)
CHLORIDE: 105 mmol/L (ref 101–111)
CO2: 26 mmol/L (ref 22–32)
CREATININE: 1.5 mg/dL — AB (ref 0.61–1.24)
Calcium: 8.3 mg/dL — ABNORMAL LOW (ref 8.9–10.3)
GFR calc Af Amer: 50 mL/min — ABNORMAL LOW (ref >60)
GFR calc non Af Amer: 43 mL/min — ABNORMAL LOW (ref >60)
Glucose, Bld: 135 mg/dL — ABNORMAL HIGH (ref 65–99)
Potassium: 4.3 mmol/L (ref 3.5–5.1)
Sodium: 136 mmol/L (ref 135–145)

## 2016-06-02 NOTE — Progress Notes (Signed)
BMP results routed to Dr John Wrenn via epic 

## 2016-06-02 NOTE — Progress Notes (Signed)
   06/02/16 1330  OBSTRUCTIVE SLEEP APNEA  Have you ever been diagnosed with sleep apnea through a sleep study? No  Do you snore loudly (loud enough to be heard through closed doors)?  1  Do you often feel tired, fatigued, or sleepy during the daytime (such as falling asleep during driving or talking to someone)? 1  Has anyone observed you stop breathing during your sleep? 1  Do you have, or are you being treated for high blood pressure? 1  BMI more than 35 kg/m2? 0  Age > 50 (1-yes) 1  Neck circumference greater than:Male 16 inches or larger, Male 17inches or larger? 0  Male Gender (Yes=1) 1  Obstructive Sleep Apnea Score 6

## 2016-06-05 NOTE — Progress Notes (Signed)
Called Dr Lora Havens cardio on 06-02-16 and 06-05-16 . Office system down unable to obtain any cardiac records from Dr Dewayne Shorter

## 2016-06-05 NOTE — H&P (Signed)
CC: BPH  HPI: Jacob Pope is a 76 year-old male established patient who is here for follow up regarding further evaluation of BPH and lower urinary tract symptoms.  The patient complains of lower urinary tract symptom(s) that include dysuria, weak stream, and nocturia. The patient states his most bothersome symptom(s) are the following: dysuria. Patient is currently treated with alfuzosin for his symptoms. His symptoms have been worse and stable over the last year.   Jacob Pope returns today in f/u. He has a history of BPH with BOO and was first seen in 11/16. He remains on Alfuzosin and is voiding ok but still has some dysuria with each void. He has had no hematuria. He has nocturia 3-4x. He will have a good stream but some intermittency. He feels like he empties. On cystoscopy he had bilobar hyperplasia without a middle lobe.      ALLERGIES: No Allergies    MEDICATIONS: Uroxatral 10 mg tablet, extended release 24 hr 1 tablet PO Daily take daily with meals for bladder emptying.  Alfuzosin HCl ER 10 MG Oral Tablet Extended Release 24 Hour 0 Oral  Losartan Potassium 50 MG Oral Tablet Oral  NIFEdipine ER Osmotic Release 30 MG Oral Tablet Extended Release 24 Hour Oral     GU PSH: None     PSH Notes: Cataract Surgery   NON-GU PSH: None   GU PMH: BPH w/LUTS (Stable) - 01/19/2016, Benign prostatic hyperplasia with urinary obstruction, - 05/20/2015 Nocturia, Nocturia - 05/20/2015 Weak Urinary Stream, Weak urinary stream - 05/20/2015    NON-GU PMH: Encounter for general adult medical examination without abnormal findings, Encounter for preventive health examination - 05/20/2015 Anxiety, Anxiety - 03/18/2015 Personal history of other diseases of the circulatory system, History of hypertension - 03/18/2015 Personal history of other diseases of the nervous system and sense organs, History of glaucoma - 03/18/2015 Personal history of other endocrine, nutritional and metabolic disease, History of  hypercholesterolemia - 03/18/2015 Personal history of other mental and behavioral disorders, History of depression - 03/18/2015 Personal history of other specified conditions, History of insomnia - 03/18/2015 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   Tobacco Use Assessment Completed: Used Tobacco in last 30 days? Patient's occupation is/was retired.     Notes: Former smoker, Former smoker, Alcohol use, Married, Retired, Number of children   REVIEW OF SYSTEMS:    GU Review Male:   Patient reports frequent urination, burning/ pain with urination, leakage of urine, and stream starts and stops. Patient denies hard to postpone urination, get up at night to urinate, trouble starting your stream, have to strain to urinate , erection problems, and penile pain.  Gastrointestinal (Upper):   Patient denies nausea, vomiting, and indigestion/ heartburn.  Gastrointestinal (Lower):   Patient denies diarrhea and constipation.  Constitutional:   Patient denies fever, night sweats, weight loss, and fatigue.  Skin:   Patient denies skin rash/ lesion and itching.  Eyes:   Patient denies blurred vision and double vision.  Ears/ Nose/ Throat:   Patient denies sore throat and sinus problems.  Hematologic/Lymphatic:   Patient denies swollen glands and easy bruising.  Cardiovascular:   Patient denies leg swelling and chest pains.  Respiratory:   Patient denies cough and shortness of breath.  Endocrine:  Patient reports excessive thirst.   Musculoskeletal:   Patient denies back pain and joint pain.  Neurological:   Patient denies headaches and dizziness.  Psychologic:   Patient reports anxiety. Patient denies depression.   VITAL SIGNS:       05/22/2016 11:48 AM  Weight 142 lb / 64.41 kg  Height 63 in / 160.02 cm  BP 166/78 mmHg  Pulse 51 /min  BMI 25.2 kg/m   MULTI-SYSTEM PHYSICAL EXAMINATION:    Constitutional: Well-nourished. No physical deformities. Normally developed. Good grooming.  Respiratory: No labored breathing, no use of accessory muscles. CTA  Cardiovascular: Normal temperature, RRR without murmur.      PAST DATA REVIEWED:  Source Of History:  Patient  Urine Test Review:   Urinalysis   PROCEDURES:          Urinalysis w/Scope - 81001 Dipstick Dipstick Cont'd Micro  Color: Yellow Bilirubin: Neg WBC/hpf: 0 - 5/hpf  Appearance: Clear Ketones: Neg RBC/hpf: 0 - 2/hpf  Specific Gravity: 1.010 Blood: Trace Bacteria: NS (Not Seen)  pH: 6.5 Protein: Neg Cystals: NS (Not Seen)  Glucose: Neg Urobilinogen: 0.2 Casts: NS (Not Seen)    Nitrites: Neg Trichomonas: Not Present    Leukocyte Esterase: Neg Mucous: Not Present      Epithelial Cells: 0 - 5/hpf      Yeast: NS (Not Seen)      Sperm: Not Present    ASSESSMENT:      ICD-10 Details  1 GU:   BPH w/LUTS - N40.1   2   Nocturia - R35.1   3   Weak Urinary Stream - R39.12    PLAN:           Schedule Return Visit/Planned Activity: Next Available Appointment - Schedule Surgery          Document Letter(s):  Created for Patient: Clinical Summary         Notes:   He has progressive voiding symptoms on alfuzosin.   I discussed adding finasteride, Urolift and TURP and will get him set up for a TURP. The risks were reviewed in detail through a translator including bleeding, infection, injury to the urethra and bladder, incontinence, strictures, sexual dysfunction, thrombotic events and anesthetic complications.

## 2016-06-05 NOTE — Progress Notes (Signed)
Spoke with dr singer anesthesia and showed him ekg 11-07-11, 05-19-13 and 06-02-16. Patient ok for surgery per dr singer and d r singer aware unable to obtain cardiac records from dr Einar Gip office due to their system down,

## 2016-06-06 ENCOUNTER — Ambulatory Visit (HOSPITAL_COMMUNITY): Payer: Medicare Other | Admitting: Anesthesiology

## 2016-06-06 ENCOUNTER — Encounter (HOSPITAL_COMMUNITY)
Admission: RE | Disposition: A | Discharge status: Home / Self Care | Payer: Self-pay | Source: Ambulatory Visit | Attending: Urology

## 2016-06-06 ENCOUNTER — Encounter (HOSPITAL_COMMUNITY): Payer: Self-pay | Admitting: *Deleted

## 2016-06-06 ENCOUNTER — Observation Stay (HOSPITAL_COMMUNITY)
Admission: RE | Admit: 2016-06-06 | Discharge: 2016-06-07 | Disposition: A | Discharge status: Home / Self Care | Payer: Medicare Other | Source: Ambulatory Visit | Attending: Urology | Admitting: Urology

## 2016-06-06 DIAGNOSIS — Z87891 Personal history of nicotine dependence: Secondary | ICD-10-CM | POA: Insufficient documentation

## 2016-06-06 DIAGNOSIS — R3912 Poor urinary stream: Secondary | ICD-10-CM | POA: Diagnosis not present

## 2016-06-06 DIAGNOSIS — Z79899 Other long term (current) drug therapy: Secondary | ICD-10-CM | POA: Insufficient documentation

## 2016-06-06 DIAGNOSIS — F0390 Unspecified dementia without behavioral disturbance: Secondary | ICD-10-CM | POA: Diagnosis not present

## 2016-06-06 DIAGNOSIS — N401 Enlarged prostate with lower urinary tract symptoms: Principal | ICD-10-CM | POA: Insufficient documentation

## 2016-06-06 DIAGNOSIS — I10 Essential (primary) hypertension: Secondary | ICD-10-CM | POA: Insufficient documentation

## 2016-06-06 DIAGNOSIS — N411 Chronic prostatitis: Secondary | ICD-10-CM | POA: Diagnosis not present

## 2016-06-06 DIAGNOSIS — N320 Bladder-neck obstruction: Secondary | ICD-10-CM | POA: Diagnosis not present

## 2016-06-06 DIAGNOSIS — R351 Nocturia: Secondary | ICD-10-CM | POA: Diagnosis not present

## 2016-06-06 DIAGNOSIS — N138 Other obstructive and reflux uropathy: Secondary | ICD-10-CM | POA: Diagnosis present

## 2016-06-06 DIAGNOSIS — I252 Old myocardial infarction: Secondary | ICD-10-CM | POA: Insufficient documentation

## 2016-06-06 DIAGNOSIS — N400 Enlarged prostate without lower urinary tract symptoms: Secondary | ICD-10-CM | POA: Diagnosis not present

## 2016-06-06 DIAGNOSIS — Z8673 Personal history of transient ischemic attack (TIA), and cerebral infarction without residual deficits: Secondary | ICD-10-CM | POA: Insufficient documentation

## 2016-06-06 HISTORY — PX: TRANSURETHRAL RESECTION OF PROSTATE: SHX73

## 2016-06-06 SURGERY — TURP (TRANSURETHRAL RESECTION OF PROSTATE)
Anesthesia: Choice

## 2016-06-06 MED ORDER — MEPERIDINE HCL 50 MG/ML IJ SOLN: 6.2500 mg | INTRAMUSCULAR | Status: DC | PRN

## 2016-06-06 MED ORDER — HYOSCYAMINE SULFATE 0.125 MG SL SUBL
0.1250 mg | SUBLINGUAL_TABLET | SUBLINGUAL | Status: DC | PRN
  Filled 2016-06-06: qty 1

## 2016-06-06 MED ORDER — ONDANSETRON HCL 4 MG/2ML IJ SOLN: 4.0000 mg | INTRAMUSCULAR | Status: DC | PRN

## 2016-06-06 MED ORDER — LIDOCAINE 2% (20 MG/ML) 5 ML SYRINGE
INTRAMUSCULAR | Status: AC
  Filled 2016-06-06: qty 5

## 2016-06-06 MED ORDER — MIDAZOLAM HCL 2 MG/2ML IJ SOLN: 0.5000 mg | Freq: Once | INTRAMUSCULAR | Status: DC | PRN

## 2016-06-06 MED ORDER — KCL IN DEXTROSE-NACL 20-5-0.45 MEQ/L-%-% IV SOLN
INTRAVENOUS | Status: AC
  Filled 2016-06-06: qty 1000

## 2016-06-06 MED ORDER — EPHEDRINE SULFATE-NACL 50-0.9 MG/10ML-% IV SOSY
PREFILLED_SYRINGE | INTRAVENOUS | Status: DC | PRN
  Administered 2016-06-06: 5 mg via INTRAVENOUS

## 2016-06-06 MED ORDER — EPHEDRINE 5 MG/ML INJ
INTRAVENOUS | Status: AC
  Filled 2016-06-06: qty 10

## 2016-06-06 MED ORDER — HYDROMORPHONE HCL 2 MG/ML IJ SOLN
0.5000 mg | INTRAMUSCULAR | Status: DC | PRN
  Administered 2016-06-06: 0.5 mg via INTRAVENOUS
  Filled 2016-06-06: qty 1

## 2016-06-06 MED ORDER — ROCURONIUM BROMIDE 50 MG/5ML IV SOSY
PREFILLED_SYRINGE | INTRAVENOUS | Status: AC
  Filled 2016-06-06: qty 5

## 2016-06-06 MED ORDER — ONDANSETRON HCL 4 MG/2ML IJ SOLN
INTRAMUSCULAR | Status: AC
  Filled 2016-06-06: qty 2

## 2016-06-06 MED ORDER — ZOLPIDEM TARTRATE 5 MG PO TABS: 5.0000 mg | ORAL_TABLET | Freq: Every evening | ORAL | Status: DC | PRN

## 2016-06-06 MED ORDER — SUGAMMADEX SODIUM 200 MG/2ML IV SOLN
INTRAVENOUS | Status: AC
  Filled 2016-06-06: qty 2

## 2016-06-06 MED ORDER — HYDROCODONE-ACETAMINOPHEN 5-325 MG PO TABS
1.0000 | ORAL_TABLET | ORAL | Status: DC | PRN
  Administered 2016-06-06: 2 via ORAL
  Filled 2016-06-06 (×2): qty 2

## 2016-06-06 MED ORDER — LOSARTAN POTASSIUM 50 MG PO TABS
50.0000 mg | ORAL_TABLET | Freq: Every evening | ORAL | Status: DC
  Administered 2016-06-06: 50 mg via ORAL
  Filled 2016-06-06: qty 1

## 2016-06-06 MED ORDER — ACETAMINOPHEN 325 MG PO TABS: 650.0000 mg | ORAL_TABLET | ORAL | Status: DC | PRN

## 2016-06-06 MED ORDER — LOSARTAN POTASSIUM 50 MG PO TABS: 25.0000 mg | ORAL_TABLET | Freq: Two times a day (BID) | ORAL | Status: DC

## 2016-06-06 MED ORDER — KCL IN DEXTROSE-NACL 20-5-0.45 MEQ/L-%-% IV SOLN
INTRAVENOUS | Status: DC
  Administered 2016-06-06 – 2016-06-07 (×2): via INTRAVENOUS
  Filled 2016-06-06 (×2): qty 1000

## 2016-06-06 MED ORDER — DIPHENHYDRAMINE HCL 12.5 MG/5ML PO ELIX: 12.5000 mg | ORAL_SOLUTION | Freq: Four times a day (QID) | ORAL | Status: DC | PRN

## 2016-06-06 MED ORDER — LOSARTAN POTASSIUM 50 MG PO TABS
25.0000 mg | ORAL_TABLET | Freq: Every day | ORAL | Status: DC
  Administered 2016-06-07: 25 mg via ORAL
  Filled 2016-06-06: qty 1

## 2016-06-06 MED ORDER — FENTANYL CITRATE (PF) 100 MCG/2ML IJ SOLN
INTRAMUSCULAR | Status: AC
  Filled 2016-06-06: qty 2

## 2016-06-06 MED ORDER — DEXAMETHASONE SODIUM PHOSPHATE 10 MG/ML IJ SOLN
INTRAMUSCULAR | Status: AC
  Filled 2016-06-06: qty 1

## 2016-06-06 MED ORDER — ONDANSETRON HCL 4 MG/2ML IJ SOLN
INTRAMUSCULAR | Status: DC | PRN
  Administered 2016-06-06: 4 mg via INTRAVENOUS

## 2016-06-06 MED ORDER — FENTANYL CITRATE (PF) 100 MCG/2ML IJ SOLN: 25.0000 ug | INTRAMUSCULAR | Status: DC | PRN

## 2016-06-06 MED ORDER — CEPHALEXIN 500 MG PO CAPS
500.0000 mg | ORAL_CAPSULE | Freq: Three times a day (TID) | ORAL | Status: DC
  Administered 2016-06-06 – 2016-06-07 (×3): 500 mg via ORAL
  Filled 2016-06-06 (×4): qty 1

## 2016-06-06 MED ORDER — FENTANYL CITRATE (PF) 100 MCG/2ML IJ SOLN
25.0000 ug | INTRAMUSCULAR | Status: DC | PRN
  Administered 2016-06-06 (×2): 25 ug via INTRAVENOUS

## 2016-06-06 MED ORDER — ROCURONIUM BROMIDE 50 MG/5ML IV SOSY
PREFILLED_SYRINGE | INTRAVENOUS | Status: AC
Start: 2016-06-06 — End: 2016-06-06
  Filled 2016-06-06: qty 5

## 2016-06-06 MED ORDER — FENTANYL CITRATE (PF) 100 MCG/2ML IJ SOLN
INTRAMUSCULAR | Status: DC | PRN
  Administered 2016-06-06: 100 ug via INTRAVENOUS

## 2016-06-06 MED ORDER — CEFAZOLIN SODIUM-DEXTROSE 2-4 GM/100ML-% IV SOLN
INTRAVENOUS | Status: AC
  Filled 2016-06-06: qty 100

## 2016-06-06 MED ORDER — BISACODYL 10 MG RE SUPP: 10.0000 mg | Freq: Every day | RECTAL | Status: DC | PRN

## 2016-06-06 MED ORDER — PROMETHAZINE HCL 25 MG/ML IJ SOLN: 6.2500 mg | INTRAMUSCULAR | Status: DC | PRN

## 2016-06-06 MED ORDER — PROPOFOL 10 MG/ML IV BOLUS
INTRAVENOUS | Status: DC | PRN
  Administered 2016-06-06: 110 mg via INTRAVENOUS

## 2016-06-06 MED ORDER — ROCURONIUM BROMIDE 10 MG/ML (PF) SYRINGE
PREFILLED_SYRINGE | INTRAVENOUS | Status: DC | PRN
  Administered 2016-06-06: 40 mg via INTRAVENOUS

## 2016-06-06 MED ORDER — SENNOSIDES-DOCUSATE SODIUM 8.6-50 MG PO TABS: 1.0000 | ORAL_TABLET | Freq: Every evening | ORAL | Status: DC | PRN

## 2016-06-06 MED ORDER — ATENOLOL 25 MG PO TABS: 25.0000 mg | ORAL_TABLET | Freq: Every day | ORAL | Status: DC | PRN

## 2016-06-06 MED ORDER — LACTATED RINGERS IV SOLN
INTRAVENOUS | Status: DC | PRN
  Administered 2016-06-06: 07:00:00 via INTRAVENOUS

## 2016-06-06 MED ORDER — PROPOFOL 10 MG/ML IV BOLUS
INTRAVENOUS | Status: AC
  Filled 2016-06-06: qty 40

## 2016-06-06 MED ORDER — CEFAZOLIN SODIUM-DEXTROSE 2-4 GM/100ML-% IV SOLN
2.0000 g | INTRAVENOUS | Status: AC
  Administered 2016-06-06: 2 g via INTRAVENOUS
  Filled 2016-06-06: qty 100

## 2016-06-06 MED ORDER — SUGAMMADEX SODIUM 200 MG/2ML IV SOLN
INTRAVENOUS | Status: DC | PRN
  Administered 2016-06-06: 200 mg via INTRAVENOUS

## 2016-06-06 MED ORDER — DEXAMETHASONE SODIUM PHOSPHATE 10 MG/ML IJ SOLN
INTRAMUSCULAR | Status: DC | PRN
  Administered 2016-06-06: 10 mg via INTRAVENOUS

## 2016-06-06 MED ORDER — DOCUSATE SODIUM 100 MG PO CAPS
100.0000 mg | ORAL_CAPSULE | Freq: Two times a day (BID) | ORAL | Status: DC
  Administered 2016-06-06 – 2016-06-07 (×2): 100 mg via ORAL
  Filled 2016-06-06 (×3): qty 1

## 2016-06-06 MED ORDER — DIPHENHYDRAMINE HCL 50 MG/ML IJ SOLN: 12.5000 mg | Freq: Four times a day (QID) | INTRAMUSCULAR | Status: DC | PRN

## 2016-06-06 MED ORDER — SODIUM CHLORIDE 0.9 % IR SOLN
3000.0000 mL | Status: DC
  Administered 2016-06-06 – 2016-06-07 (×3): 3000 mL

## 2016-06-06 MED ORDER — SODIUM CHLORIDE 0.9 % IR SOLN
Status: DC | PRN
  Administered 2016-06-06: 9000 mL via INTRAVESICAL

## 2016-06-06 MED ORDER — LIDOCAINE 2% (20 MG/ML) 5 ML SYRINGE: INTRAMUSCULAR | Status: DC | PRN

## 2016-06-06 MED ORDER — TRAMADOL HCL 50 MG PO TABS: 50.0000 mg | ORAL_TABLET | Freq: Four times a day (QID) | ORAL | 0 refills | Status: DC | PRN

## 2016-06-06 SURGICAL SUPPLY — 16 items
BAG URINE DRAINAGE (UROLOGICAL SUPPLIES) ×3 IMPLANT
BAG URO CATCHER STRL LF (MISCELLANEOUS) ×3 IMPLANT
CATH FOLEY 3WAY 30CC 22FR (CATHETERS) ×3 IMPLANT
ELECT REM PT RETURN 9FT ADLT (ELECTROSURGICAL) ×3
ELECTRODE REM PT RTRN 9FT ADLT (ELECTROSURGICAL) ×1 IMPLANT
GLOVE SURG SS PI 8.0 STRL IVOR (GLOVE) IMPLANT
GOWN STRL REUS W/TWL XL LVL3 (GOWN DISPOSABLE) ×3 IMPLANT
HOLDER FOLEY CATH W/STRAP (MISCELLANEOUS) ×3 IMPLANT
LOOP CUT BIPOLAR 24F LRG (ELECTROSURGICAL) IMPLANT
MANIFOLD NEPTUNE II (INSTRUMENTS) ×3 IMPLANT
PACK CYSTO (CUSTOM PROCEDURE TRAY) ×3 IMPLANT
SET ASPIRATION TUBING (TUBING) ×3 IMPLANT
SYR 30ML LL (SYRINGE) ×3 IMPLANT
SYRINGE IRR TOOMEY STRL 70CC (SYRINGE) ×3 IMPLANT
TUBING CONNECTING 10 (TUBING) ×2 IMPLANT
TUBING CONNECTING 10' (TUBING) ×1

## 2016-06-06 NOTE — Anesthesia Postprocedure Evaluation (Signed)
Anesthesia Post Note  Patient: Jacob Pope  Procedure(s) Performed: Procedure(s) (LRB): TRANSURETHRAL RESECTION OF THE PROSTATE (TURP) (N/A)  Patient location during evaluation: PACU Anesthesia Type: General Level of consciousness: awake and alert, oriented and patient cooperative Pain management: pain level controlled Vital Signs Assessment: post-procedure vital signs reviewed and stable Respiratory status: spontaneous breathing, nonlabored ventilation and respiratory function stable Cardiovascular status: blood pressure returned to baseline and stable Postop Assessment: no signs of nausea or vomiting Anesthetic complications: no       Last Vitals:  Vitals:   06/06/16 1230 06/06/16 1245  BP: (!) 147/74 (!) 149/72  Pulse: 64 63  Resp: 14 14  Temp:  36.6 C    Last Pain:  Vitals:   06/06/16 1315  TempSrc:   PainSc: 2                  Heddy Vidana,E. Nashid Pellum

## 2016-06-06 NOTE — Progress Notes (Signed)
Patient ID: Jacob Pope, male   DOB: 03/28/1941, 76 y.o.   MRN: [id number]  Doing well post op.  Urine clear.  BP (!) 149/76   Pulse 68   Temp 97.8 F (36.6 C)   Resp 15   Ht 5\' 3"  (1.6 m)   Wt 62.6 kg (138 lb)   SpO2 99%   BMI 24.45 kg/m    Plan to d/c foley in am.

## 2016-06-06 NOTE — Anesthesia Procedure Notes (Signed)
Procedure Name: Intubation Date/Time: 06/06/2016 7:39 AM Performed by: Angellica Maddison, Virgel Gess Pre-anesthesia Checklist: Patient identified, Emergency Drugs available, Suction available, Patient being monitored and Timeout performed Patient Re-evaluated:Patient Re-evaluated prior to inductionOxygen Delivery Method: Circle system utilized Preoxygenation: Pre-oxygenation with 100% oxygen Intubation Type: IV induction Ventilation: Mask ventilation without difficulty Laryngoscope Size: Mac and 4 Grade View: Grade I Tube type: Oral Tube size: 7.5 mm Number of attempts: 1 Airway Equipment and Method: Stylet Placement Confirmation: ETT inserted through vocal cords under direct vision,  positive ETCO2,  CO2 detector and breath sounds checked- equal and bilateral Secured at: 22 cm Tube secured with: Tape Dental Injury: Teeth and Oropharynx as per pre-operative assessment
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
## 2016-06-06 NOTE — Interval H&P Note (Signed)
History and Physical Interval Note:  06/06/2016 7:22 AM  Jacob Pope  has presented today for surgery, with the diagnosis of BPH WITH BLADDER OUTLET OBSTRUCTION  The various methods of treatment have been discussed with the patient and family. After consideration of risks, benefits and other options for treatment, the patient has consented to  Procedure(s): TRANSURETHRAL RESECTION OF THE PROSTATE (TURP) (N/A) as a surgical intervention .  The patient's history has been reviewed, patient examined, no change in status, stable for surgery.  I have reviewed the patient's chart and labs.  Questions were answered to the patient's satisfaction.     [name] J

## 2016-06-06 NOTE — Brief Op Note (Signed)
06/06/2016  8:43 AM  PATIENT:  Jacob Pope  76 y.o. male  PRE-OPERATIVE DIAGNOSIS:  BPH WITH BLADDER OUTLET OBSTRUCTION  POST-OPERATIVE DIAGNOSIS:  BPH WITH BLADDER OUTLET OBSTRUCTION  PROCEDURE:  Procedure(s): TRANSURETHRAL RESECTION OF THE PROSTATE (TURP) (N/A)  SURGEON:  Surgeon(s) and Role:    * Irine Seal, MD - Primary  PHYSICIAN ASSISTANT:   ASSISTANTS: none   ANESTHESIA:   general  EBL:  Total I/O In: 600 [I.V.:600] Out: -   BLOOD ADMINISTERED:none  DRAINS: Urinary Catheter (Foley)   LOCAL MEDICATIONS USED:  NONE  SPECIMEN:  Source of Specimen:  prostate chips  DISPOSITION OF SPECIMEN:  PATHOLOGY  COUNTS:  YES  TOURNIQUET:  * No tourniquets in log *  DICTATION: .Other Dictation: Dictation Number [id number]  PLAN OF CARE: Admit for overnight observation  PATIENT DISPOSITION:  PACU - hemodynamically stable.   Delay start of Pharmacological VTE agent (>24hrs) due to surgical blood loss or risk of bleeding: yes

## 2016-06-06 NOTE — Transfer of Care (Signed)
Immediate Anesthesia Transfer of Care Note  Patient: Jacob Pope  Procedure(s) Performed: Procedure(s): TRANSURETHRAL RESECTION OF THE PROSTATE (TURP) (N/A)  Patient Location: PACU  Anesthesia Type:General  Level of Consciousness:  sedated, patient cooperative and responds to stimulation  Airway & Oxygen Therapy:Patient Spontanous Breathing and Patient connected to face mask oxgen  Post-op Assessment:  Report given to PACU RN and Post -op Vital signs reviewed and stable  Post vital signs:  Reviewed and stable  Last Vitals:  Vitals:   06/06/16 0508  BP: (!) 111/57  Pulse: 65  Resp: 18  Temp: 123XX123 C    Complications: No apparent anesthesia complications

## 2016-06-06 NOTE — Discharge Instructions (Signed)
Transurethral Resection of the Prostate, Care After Introduction Refer to this sheet in the next few weeks. These instructions provide you with information about caring for yourself after your procedure. Your health care provider may also give you more specific instructions. Your treatment has been planned according to current medical practices, but problems sometimes occur. Call your health care provider if you have any problems or questions after your procedure. What can I expect after the procedure? After the procedure, it is common to have: Mild pain in your lower abdomen. Soreness or mild discomfort in your penis from having the catheter inserted during the procedure. A feeling of urgency when you need to urinate. A small amount of blood in your urine. You may notice some small blood clots in your urine. These are normal. Follow these instructions at home: Medicines Take over-the-counter and prescription medicines only as told by your health care provider. Do not drive or operate heavy machinery while taking prescription pain medicine. Do not drive for 24 hours if you received a sedative. If you were prescribed antibiotic medicine, take it as told by your health care provider. Do not stop taking the antibiotic even if you start to feel better. Activity Return to your normal activities as told by your health care provider. Ask your health care provider what activities are safe for you. Do not lift anything that is heavier than 10 lb (4.5 kg) for 3 weeks after your procedure, or as long as told by your health care provider. Avoid intense physical activity for as long as told by your health care provider. Walk at least one time every day. This helps to prevent blood clots. You may increase your physical activity gradually as you start to feel better. Lifestyle Do not drink alcohol for as long as told by your health care provider. This is especially important if you are taking prescription pain  medicines. Do not engage in sexual activity until your health care provider says that you can do this. General instructions Do not take baths, swim, or use a hot tub until your health care provider approves. Drink enough fluid to keep your urine clear or pale yellow. Urinate as soon as you feel the need to. Do not try to hold your urine for long periods of time. If your health care provider approves, you may take a stool softener for 2-3 weeks to prevent you from straining to have a bowel movement. Wear compression stockings as told by your health care provider. These stockings help to prevent blood clots and reduce swelling in your legs. Keep all follow-up visits as told by your health care provider. This is important. Contact a health care provider if: You have difficulty urinating. You have a fever. You have pain that gets worse or does not improve with medicine. You have blood in your urine that does not go away after 1 week of resting and drinking more fluids. You have swelling in your penis or testicles. Get help right away if: You are unable to urinate. You are having more blood clots in your urine instead of fewer. You have: Large blood clots. A lot of blood in your urine. Pain in your back or lower abdomen. Pain or swelling in your legs. Chills and you are shaking. This information is not intended to replace advice given to you by your health care provider. Make sure you discuss any questions you have with your health care provider. Document Released: 05/01/2005 Document Revised: 01/02/2016 Document Reviewed: 01/21/2015  2017  Elsevier C??t bo? tuy?n ti?n li?t qua ni?u ?a?o (Transurethral Resection of the Prostate) C??t bo? tuy?n ti?n li?t qua ni?u ?a?o (TURP) la? loa?i bo? (c??t bo?) m?t ph?n cu?a tuy?n sa?n xu?t tinh di?ch (tuy?n ti?n li?t). Thu? thu?t na?y ????c th??c hi?n ?? ?i?u tri? ti?nh tra?ng t?ng sa?n tuy?n ti?n li?t la?nh ti?nh (BPH). BPH la?  ti?nh tra?ng s? l???ng t? ba?o t?o nn cc m c?a tuy?n ti?n li?t t?ng ln m?t cch b?t th???ng, khng pha?i ung th? (la?nh ti?nh). BPH la?m cho tuy?n ti?n li?t to ln. Tuy?n ti?n li?t to ra co? th? che?n ho??c la?m t??c ?ng d?n n???c ti?u t?? ba?ng quang ra kho?i c? th? (ni?u ?a?o). BPH co? th? a?nh h??ng ??n do?ng n???c ti?u bi?nh th???ng do gy nhi?m tru?ng ba?ng quang, kho? ki?m soa?t ch?c n?ng c?a bng quang va? kho? ?i h?t n???c ti?u. Mu?c tiu cu?a TURP la? c??t bo? ?u? l??ng m tuy?n ti?n li?t ?? cho phe?p do?ng n???c ti?u ?i qua bi?nh th???ng. Trong m?t thu? thu?t c??t bo? qua ni?u ?a?o, m?t ?ng n?i soi m?ng co? ?e?n, m?t camera nho? va? m?t l???i c??t b?ng ?i?n (?ng soi c??t ?o?n ) ????c lu?n qua ni?u ?a?o va?o tuy?n ti?n li?t. C??a ni?u ?a?o la? ?? cu?i d??ng v?t. HY CHO CHUYN GIA CH?M [place] S?C KH?E BI?T V?:  B?t k? v?n ?? d? ?ng no m qu v? c.  T?t c? cc lo?i thu?c m qu v? ?ang s? d?ng, bao g?m c? vitamin, th?o d??c, thu?c nh? m?t, thu?c d?ng kem, thu?c khng c?n k ??n.  Nh?ng v?n ?? tr??c ?y qu v? ho?c ng??i trong gia ?nh qu v? ? b? khi s? d?ng thu?c gy m.  B?t k? cc b?nh l v? mu no m qu v? c.  Cc ph?u thu?t tr??c ? qu v? ? ???c lm.  B?t k? tnh tr?ng b?nh l no c?a qu v?.  B?t ky? nhi?m tru?ng tuy?n ti?n li?t na?o quy? vi? ?a? b?. NGUY C? V BI?N CH?NG Ni chung, ?y l m?t th? thu?t an ton. Tuy nhin, cc v?n ?? c th? x?y ra, bao g?m:  Nhi?m trng.  Ch?y mu.  Ph?n ?ng di? ??ng v?i thu?c.  Gy th??ng t?n ca?c c?u tru?c ho??c c? quan kha?c, ch?ng ha?n nh?:  Ni?u ??o.  Bng quang.  Ca?c c? xung quanh tuy?n ti?n li?t.  Kho? c??ng c??ng.  Kho? ki?m soa?t ti?u ti?n (khng t?? chu?).  Se?o co? th? gy ra v?n ?? v??i do?ng n???c ti?u. TR??C KHI TH?C HI?N TH? THU?T  Tun th? ch? ??n c?a chuyn gia ch?m [place] s?c kh?e v? cc h?n ch? ?n ho?c u?ng.  Hy h?i chuyn gia ch?m [place] s?c kh?e  v?:  Vi?c thay ??i ho?c d?ng s? d?ng cc lo?i thu?c th??ng xuyn dng c?a qu v?. ?i?u ny ??c bi?t quan tr?ng n?u qu v? ?ang dng thu?c tr? ti?u ???ng ho?c thu?c ch?ng ?ng mu.  ?ang dng cc lo?i thu?c nh? aspirin v ibuprofen. Nh?ng thu?c ny c th? lm long mu. Khng dng nh?ng lo?i thu?c ny tr??c khi lm th? thu?t n?u chuyn gia ch?m [place] s?c kh?e yu c?u qu v? khng dng.  Quy? vi? co? th? ???c kha?m th??c th?.  Quy? vi? co? th? ???c l?y m?u ma?u va? n???c ti?u.  Qu v? c th? ???c cho dng khng sinh ?? gip ng?n ng?a nhi?m trng.  Hy h?i chuyn gia ch?m  s?c kh?e v? vi?c v?t m? c?a qu  v? s? ???c ?nh d?u ho?c xc ??nh nh? th? no.  C k? ho?ch thu x?p cho ng??i ??a qu v? v? nh sau khi lm th? thu?t. Quy? vi? co? th? khng la?i xe ????c trong th??i gian t?i ?a l 10 nga?y sau khi la?m thu? thu?t. THU? THU?T  ?? gi?m nguy c? nhi?m trng:  Nhm nhn vin ch?m [place] s?c kh?e c?a qu v? s? r?a tay ho?c v? sinh tay c?a h?.  Da c?a qu v? s? ???c r?a b?ng x phng.  Qu v? s? ???c ??t m?t ?ng truy?n t?nh m?ch (IV) vo m?t trong cc t?nh m?ch.  Qu v? s? ???c cho dng m?t ho?c nhi?u trong s? nh?ng lo?i thu?c sau:  Thu?c ?? gip quy? vi? th? gin (thu?c an th?n).  Thu?c lm qu v? ng? (thu?c gy m toa?n thn).  Thu?c ???c tim va?o c?t s?ng ?? la?m t khu v?c bn d???i va? ngay phi?a trn ch? tim (gy t t?y s?ng).  Chn quy? vi? se? ???c ??t va?o gia? ??? chn (ba?n ?a?p) ?? chn quy? vi? ca?ch xa va? ??u g?i g?p la?i.  ?ng soi c??t ?o?n se? ????c lu?n qua ni?u ?a?o va?o tuy?n ti?n li?t.  Ca?c ph?n cu?a tuy?n ti?n li?t se? ????c c??t b??ng l???i c??t cu?a ?ng soi c??t ?o?n.  ?ng soi c??t ?o?n se? ???c tha?o ra.  M?t ?ng nho?, m?m (?ng thng) se? ????c lu?n qua ni?u ?a?o va?o ba?ng quang. ?ng thng se? d?n n???c ti?u va?o m?t ca?i tu?i bn ngoa?i c? th? quy? vi?.  Ch?t di?ch co? th? cha?y qua ?ng thng ?? gi?? cho ?ng  thng [employer]. Th? thu?t ny c th? khc nhau gi?a cc chuyn gia ch?m [place] s?c kh?e v cc b?nh vi?n. SAU KHI TH?C HI?N TH? THU?T  Huy?t p, nh?p tim, nh?p th? v n?ng ??  xi trong mu c?a qu v? s? ???c theo di th??ng xuyn cho ??n khi thu?c qu v? ? dng h?t tc d?ng.  Quy? vi? co? th? ti?p tu?c ????c truy?n di?ch va? thu?c thng qua m?t ?ng truy?n t?nh m?ch (IV).  Quy? vi? co? th? ?au m?t chu?t. Thu?c gia?m ?au se? co? s??n ?? giu?p lm gi?m ?au cho quy? vi?.  Quy? vi? se? co? m?t ?ng thng ?? d?n l?u n???c ti?u.  Trong n??c ti?u cu?a quy? vi? co? th? co? ma?u. ?ng thng cu?a quy? vi? co? th? ???c gi?? la?i cho ??n khi h?t n???c ti?u.  Vi?c d?n l?u n???c ti?u se? ???c theo do?i. N?u c?n thi?t, ba?ng quang cu?a quy? vi? co? th? ???c r??a tri (r??a) qua ?ng thng.  Quy? vi? se? ???c khuy?n khi?ch ?i la?i ca?ng s??m ca?ng t?t.  Qu v? c th? ph?i ?i t?t p. T?t p gip ng?n ng?a cc c?c mu ?ng v gi?m s?ng ? chn qu v?.  Khng li xe trong vng 24 gi? n?u qu v? ? dng thu?c an th?n. Thng tin ny khng nh?m m?c ?ch thay th? cho l?i khuyn m chuyn gia ch?m [place] s?c kh?e ni v?i qu v?. Hy b?o ??m qu v? ph?i th?o lu?n b?t k? v?n ?? g m qu v? c v?i chuyn gia ch?m [place] s?c kh?e c?a qu v?. Document Released: 11/14/2010 Document Revised: 08/23/2015 Document Reviewed: 01/21/2015 Elsevier Interactive Patient Education  2017 [employer].

## 2016-06-07 DIAGNOSIS — I10 Essential (primary) hypertension: Secondary | ICD-10-CM | POA: Diagnosis not present

## 2016-06-07 DIAGNOSIS — R3912 Poor urinary stream: Secondary | ICD-10-CM | POA: Diagnosis not present

## 2016-06-07 DIAGNOSIS — N401 Enlarged prostate with lower urinary tract symptoms: Secondary | ICD-10-CM | POA: Diagnosis not present

## 2016-06-07 DIAGNOSIS — N138 Other obstructive and reflux uropathy: Secondary | ICD-10-CM | POA: Diagnosis not present

## 2016-06-07 DIAGNOSIS — R351 Nocturia: Secondary | ICD-10-CM | POA: Diagnosis not present

## 2016-06-07 DIAGNOSIS — F0390 Unspecified dementia without behavioral disturbance: Secondary | ICD-10-CM | POA: Diagnosis not present

## 2016-06-07 NOTE — Discharge Summary (Signed)
Physician Discharge Summary  Patient ID: Jacob Pope MRN: [id number] DOB/AGE: [birth year] 76 y.o.  Admit date: 06/06/2016 Discharge date: 06/07/2016  Admission Diagnoses:  BPH with obstruction/lower urinary tract symptoms  Discharge Diagnoses:  Principal Problem:   BPH with obstruction/lower urinary tract symptoms   Past Medical History:  Diagnosis Date  . Anginal pain ([place])    last was 1-2 months ago, on nifedepine and atenolol PRN to treat  . BPH (benign prostatic hyperplasia)   . CVA (cerebral infarction)   . Dementia    "daughter states mild memory loss from stress"  . Depression   . Headache   . Hypertension   . MI, old    x2  . Pneumonia    3 years ago  . Stroke ([place])    in [place], small TIA 10 years ago    Surgeries: Procedure(s): TRANSURETHRAL RESECTION OF THE PROSTATE (TURP) on 06/06/2016   Consultants (if any):   Discharged Condition: Improved  Hospital Course: Jacob Pope is an 76 y.o. male who was admitted 06/06/2016 with a diagnosis of BPH with obstruction/lower urinary tract symptoms and went to the operating room on 06/06/2016 and underwent the above named procedures.   His foley was removed this morning and he is voiding well.  He has some dysuria but that is not unexpected.  His urine is light pink.    He was given perioperative antibiotics:  Anti-infectives    Start     Dose/Rate Route Frequency Ordered Stop   06/06/16 1600  cephALEXin (KEFLEX) capsule 500 mg     500 mg Oral 3 times daily 06/06/16 1437     06/06/16 0526  ceFAZolin (ANCEF) IVPB 2g/100 mL premix     2 g 200 mL/hr over 30 Minutes Intravenous 30 min pre-op 06/06/16 0526 06/06/16 0745    .  He was given sequential compression devices,for DVT prophylaxis.  He benefited maximally from the hospital stay and there were no complications.    Recent vital signs:  Vitals:   06/07/16 0033 06/07/16 0535  BP: 140/70 (!) 154/79  Pulse: 68 69  Resp: 16 16  Temp: 97.8 F (36.6 C) 97.6 F  (36.4 C)    Recent laboratory studies:  Lab Results  Component Value Date   HGB 12.1 (L) 06/02/2016   HGB 17.0 05/19/2013   HGB 15.1 05/19/2013   Lab Results  Component Value Date   WBC 8.3 06/02/2016   PLT 220 06/02/2016   Lab Results  Component Value Date   INR 0.9 11/27/2006   Lab Results  Component Value Date   NA 136 06/02/2016   K 4.3 06/02/2016   CL 105 06/02/2016   CO2 26 06/02/2016   BUN 22 (H) 06/02/2016   CREATININE 1.50 (H) 06/02/2016   GLUCOSE 135 (H) 06/02/2016    Discharge Medications:   Allergies as of 06/07/2016   No Known Allergies     Medication List    TAKE these medications   atenolol 25 MG tablet Commonly known as:  TENORMIN Take 25 mg by mouth daily as needed (chest pain).   losartan 50 MG tablet Commonly known as:  COZAAR Take 25-50 mg by mouth 2 (two) times daily. 25mg  in the AM, 50mg  in the PM.   NIFEdipine 30 MG 24 hr tablet Commonly known as:  PROCARDIA-XL/ADALAT-CC/NIFEDICAL-XL Take 30 mg by mouth daily as needed. Takes if systolic blood pressure is above 150.   traMADol 50 MG tablet Commonly known as:  ULTRAM Take 1  tablet (50 mg total) by mouth every 6 (six) hours as needed.       Diagnostic Studies: No results found.  Disposition: 01-Home or Self Care  Discharge Instructions    Diet - low sodium heart healthy    Complete by:  As directed    Discontinue IV    Complete by:  As directed    Increase activity slowly    Complete by:  As directed       Follow-up Information    Karen Kays, NP On 06/20/2016.   Specialty:  Nurse Practitioner Why:  10am Contact information: [address] [place] [address] [phone]            Signed: Malka So 06/07/2016, 8:07 AM

## 2016-06-07 NOTE — Op Note (Signed)
NAMETanav, Jacob Pope                    ACCOUNT NO.:  [id number]  MEDICAL RECORD NO.:  [id number]  LOCATION:  [facility]                         FACILITY:  [facility]  PHYSICIAN:  Marshall Cork. Jeffie Pollock, M.D.    DATE OF BIRTH:  1940-09-30  DATE OF PROCEDURE:  06/06/2016 DATE OF DISCHARGE:                              OPERATIVE REPORT   PROCEDURE:  Transurethral resection of prostate.  PREOPERATIVE DIAGNOSIS:  Benign prostatic hyperplasia with bladder outlet obstruction.  POSTOPERATIVE DIAGNOSIS:  Benign prostatic hyperplasia with bladder outlet obstruction.  SURGEON:  Dr. Irine Seal.  ANESTHESIA:  General.  SPECIMENS:  Prostate chips.  DRAINS:  A 22-French 3-way Foley catheter.  BLOOD LOSS:  Minimal.  COMPLICATIONS:  None.  INDICATIONS:  [name] is a 76 year old [place] male, who has failed medical therapy for BPH and has elected TURP.  FINDINGS AND PROCEDURES:  He was taken to the operating room where general anesthetic was induced.  He was placed in lithotomy position and fitted with PAS hose.  He was given Ancef for antibiotic coverage.  His perineum and genitalia were prepped with Betadine solution and he was draped in usual sterile fashion.  Cystoscopy was performed using a 23-French scope and 30-degree lens. Examination revealed a normal urethra.  The external sphincter was intact.  The prostatic urethra was approximately 3 cm in length with lateral lobe hyperplasia, but primarily a tight bladder neck with a small middle lobe.  Examination of bladder revealed mild-to-moderate trabeculation.  No tumors, stones, or inflammation were noted.  Ureteral orifices were unremarkable.  After completion of cystoscopy, a 28-French continuous flow resectoscope sheath was placed with the aid of a visual obturator.  This was then fitted with an Beatrix Fetters handle with a bipolar loop and 30-degree lens saline was used as the irrigant.  Resection was initiated at the bladder neck where the fibers  were exposed from 5 to 7 o'clock and the middle lobe was resected.  The floor of the prostate resected out too alongside the verumontanum.  The right lateral lobe was resected from bladder neck to apex out to capsular fibers.  This was followed by the left lateral lobe.  Once the bulk of the tissue had been resected, the bladder was evacuated free of chips. Residual apical anterior and bladder neck tissue were then resected. These chips were removed.  Hemostasis was achieved.  Final inspection revealed a widely patent prostatic urethra.  No active bleeding and no retained chips.  Ureteral orifices were intact as was the external sphincter.  The scope was removed.  Pressure on the bladder produced an excellent stream.  A 22-French 3-way Foley catheter was inserted without difficulty.  The balloon was filled with 30 mL sterile fluid.  The catheter was irrigated with clear return, and placed to continuous irrigation and straight drainage.  The patient was taken down from lithotomy position.  His anesthetic was reversed.  He was moved to the recovery room in stable condition.  There were no complications.     Marshall Cork. Jeffie Pollock, M.D.     JJW/MEDQ  D:  06/06/2016  T:  06/07/2016  Job:  [id number]

## 2016-06-20 DIAGNOSIS — N401 Enlarged prostate with lower urinary tract symptoms: Secondary | ICD-10-CM | POA: Diagnosis not present

## 2016-06-20 DIAGNOSIS — R3912 Poor urinary stream: Secondary | ICD-10-CM | POA: Diagnosis not present

## 2016-08-22 DIAGNOSIS — I10 Essential (primary) hypertension: Secondary | ICD-10-CM | POA: Diagnosis not present

## 2016-08-30 DIAGNOSIS — F5101 Primary insomnia: Secondary | ICD-10-CM | POA: Diagnosis not present

## 2016-08-30 DIAGNOSIS — I10 Essential (primary) hypertension: Secondary | ICD-10-CM | POA: Diagnosis not present

## 2016-08-30 DIAGNOSIS — F331 Major depressive disorder, recurrent, moderate: Secondary | ICD-10-CM | POA: Diagnosis not present

## 2016-08-30 DIAGNOSIS — F0390 Unspecified dementia without behavioral disturbance: Secondary | ICD-10-CM | POA: Diagnosis not present

## 2016-09-12 DIAGNOSIS — I10 Essential (primary) hypertension: Secondary | ICD-10-CM | POA: Diagnosis not present

## 2016-09-12 DIAGNOSIS — R6889 Other general symptoms and signs: Secondary | ICD-10-CM | POA: Diagnosis not present

## 2016-09-12 DIAGNOSIS — R002 Palpitations: Secondary | ICD-10-CM | POA: Diagnosis not present

## 2016-09-12 DIAGNOSIS — R079 Chest pain, unspecified: Secondary | ICD-10-CM | POA: Diagnosis not present

## 2016-09-18 DIAGNOSIS — R351 Nocturia: Secondary | ICD-10-CM | POA: Diagnosis not present

## 2016-09-18 DIAGNOSIS — N401 Enlarged prostate with lower urinary tract symptoms: Secondary | ICD-10-CM | POA: Diagnosis not present

## 2016-09-19 DIAGNOSIS — N183 Chronic kidney disease, stage 3 unspecified: Secondary | ICD-10-CM | POA: Insufficient documentation

## 2016-09-19 DIAGNOSIS — I129 Hypertensive chronic kidney disease with stage 1 through stage 4 chronic kidney disease, or unspecified chronic kidney disease: Secondary | ICD-10-CM | POA: Diagnosis not present

## 2016-09-19 DIAGNOSIS — D649 Anemia, unspecified: Secondary | ICD-10-CM | POA: Diagnosis not present

## 2016-09-21 ENCOUNTER — Other Ambulatory Visit: Payer: Self-pay | Admitting: Nephrology

## 2016-09-21 DIAGNOSIS — N183 Chronic kidney disease, stage 3 unspecified: Secondary | ICD-10-CM

## 2016-09-28 ENCOUNTER — Ambulatory Visit
Admission: RE | Admit: 2016-09-28 | Discharge: 2016-09-28 | Disposition: A | Discharge status: Home / Self Care | Payer: Medicare Other | Source: Ambulatory Visit | Attending: Nephrology | Admitting: Nephrology

## 2016-09-28 DIAGNOSIS — N183 Chronic kidney disease, stage 3 unspecified: Secondary | ICD-10-CM

## 2016-09-28 DIAGNOSIS — N189 Chronic kidney disease, unspecified: Secondary | ICD-10-CM | POA: Diagnosis not present

## 2016-10-18 ENCOUNTER — Encounter: Payer: Self-pay | Admitting: Hematology and Oncology

## 2016-10-18 ENCOUNTER — Telehealth: Payer: Self-pay | Admitting: Hematology and Oncology

## 2016-10-18 NOTE — Telephone Encounter (Signed)
Scheduled an appt w/the pt's daughter for 6/8 w/Dr. Lebron Conners. She agreed to the appt date and time. Aware to arrive 30 minutes early. Letter faxed to the referring.

## 2016-10-19 ENCOUNTER — Encounter: Payer: Self-pay | Admitting: Hematology and Oncology

## 2016-10-19 DIAGNOSIS — R768 Other specified abnormal immunological findings in serum: Secondary | ICD-10-CM | POA: Insufficient documentation

## 2016-10-19 DIAGNOSIS — D649 Anemia, unspecified: Secondary | ICD-10-CM | POA: Insufficient documentation

## 2016-10-19 NOTE — Assessment & Plan Note (Addendum)
New presentation with gradually progressive anemia, renal insufficiency, and dysproteinemia are consistent with development of multiple myeloma, but confirmation of the diagnosis is required. For that purpose, we plan on obtaining additional lab work as outlined below, body imaging to assess for possible presence of lytic skeletal lesions and active extraosseous disease by means of PET/CT, and a bone marrow biopsy for direct assessment of the cellular component of the suspected disorder. Additionally, we will assess the patient for possible presence of light chain deposition, starting with electrocardiogram and fold by echocardiogram, possible upper endoscopy and renal biopsy if indicated.   If multiple myeloma is discovered, patient does have indications for treatment. Differential diagnosis would include light chain amyloidosis, monoclonal gammopathy of unclear significance, and light chain elevation of other etiologies such as autoimmune condition, infection.

## 2016-10-19 NOTE — Progress Notes (Signed)
[facility] New Visit:  Assessment: Elevated serum immunoglobulin free light chain level New presentation with gradually progressive anemia, renal insufficiency, and dysproteinemia are consistent with development of multiple myeloma, but confirmation of the diagnosis is required. For that purpose, we plan on obtaining additional lab work as outlined below, body imaging to assess for possible presence of lytic skeletal lesions and active extraosseous disease by means of PET/CT, and a bone marrow biopsy for direct assessment of the cellular component of the suspected disorder. Additionally, we will assess the patient for possible presence of light chain deposition, starting with electrocardiogram and fold by echocardiogram, possible upper endoscopy and renal biopsy if indicated.   If multiple myeloma is discovered, patient does have indications for treatment. Differential diagnosis would include light chain amyloidosis, monoclonal gammopathy of unclear significance, and light chain elevation of other etiologies such as autoimmune condition, infection.    Orders Placed This Encounter  Procedures  . Biopsy bone marrow    Standing Status:   Future    Standing Expiration Date:   10/19/2017  . NM PET Image Initial (PI) Whole Body    Standing Status:   Future    Standing Expiration Date:   10/19/2017    Order Specific Question:   Reason for Exam (SYMPTOM  OR DIAGNOSIS REQUIRED)    Answer:   Multiple myeloma staging    Order Specific Question:   If indicated for the ordered procedure, I authorize the administration of a radiopharmaceutical per Radiology protocol    Answer:   Yes    Order Specific Question:   Preferred imaging location?    Answer:   [facility]    Order Specific Question:   Radiology Contrast Protocol - do NOT remove file path    Answer:   [email or web address]  . CBC & Diff and Retic    Standing Status:   Future    Standing  Expiration Date:   10/19/2017  . Smear    Standing Status:   Future    Standing Expiration Date:   10/19/2017  . Comprehensive metabolic panel    Standing Status:   Future    Standing Expiration Date:   10/19/2017  . Lactate dehydrogenase (LDH)    Standing Status:   Future    Standing Expiration Date:   10/19/2017  . Uric acid    Standing Status:   Future    Standing Expiration Date:   10/19/2017  . Beta 2 microglobulin, serum    Standing Status:   Future    Standing Expiration Date:   10/19/2017  . Kappa/lambda light chains    Standing Status:   Future    Standing Expiration Date:   10/19/2017  . Viscosity, serum    Standing Status:   Future    Standing Expiration Date:   10/19/2017  . Multiple Myeloma Panel (SPEP&IFE w/QIG)    Standing Status:   Future    Standing Expiration Date:   10/19/2017  . 24-Hr Ur UPEP/UIFE/Light Chains/TP    Standing Status:   Future    Standing Expiration Date:   10/19/2017  . EKG 12-Lead    The encounter was conducted using patient's daughter as a Optometrist as patient does not speak [place]. All questions were answered.  . The patient knows to call the clinic with any problems, questions or concerns.  This note was electronically signed.    History of Presenting Illness Jacob Pope 76 y.o. presenting to the [facility] for evaluation  of serum free light chain disease. Please see neurological history below for details.  Patient referred due to presentation with progressive creatinine elevation which was noted in October 2017 with normal creatinines preceding that. In 2017 creatinine 1.7, April 2019 creatinine 2.9. With no evidence of proteinuria by urinalysis. Concurrent tries of total protein and dropping albumin and development of mild anemia.   Patient denies fevers, chills, night sweats. Denies unexpected weight loss. He denies significant new musculoskeletal complaints with the exception off some pain lateral to the lumbar spine on the right. He denies any  radiation of the pain down until the gluteal region on the extremities. He does have some fatigability and he can use in bilateral inguinal regions, but denies any change in sensation peripheral extremities such as numbness, tingling, burning, or itching. Denies any changes in ambulation or gait. No nausea, dysphasia, abdominal discomfort, diarrhea, or constipation. He does have bilateral slight swelling in his feet which is new for him. Denies shortness of breath, chest pain, palpitations, or cough.   Oncological/hematological History: --External Labs, 05/19/13: Cr 1.00; Hgb 17.0 --External Labs, 02/02/15: Cr 1.00;  --External Labs, 02/22/16: tProt 8.6, Alb 3.9, Cr 1.27; Hgb 13.1 --External Labs, 06/02/16: Cr 1.50; WBC 8.3, Hgb 12.1, Plt 220; --External Labs, 08/22/16: tProt 9.8, Alb 3.0, Cr 2.00; Hgb 12.7 --External Labs, 09/19/16: tProt 9.2, Alb 3.5, Ca 9.0, Cr 1.69, AP 45; SPEP -- no obvious M spike; uIFE -- no apparent monoclonal protein; kappa 299.2, lambda 78.0, KLR 3.84; WBC 7.2, Hgb 12.2, MCV 77, MCH 25.6, RDW 15.3, Plt 201; Fe 55, FeSat 24%, TIBC 225(low); C3 complement 61(low), C4 complement 7(low)  --Renal US BL, 09/28/16: Normal size and echogenicity of the kidneys without evidence of mass or hydronephrosis.  CBC    Component Value Date/Time   WBC 8.3 06/02/2016 1435   RBC 4.76 06/02/2016 1435   HGB 12.1 (L) 06/02/2016 1435   HCT 37.0 (L) 06/02/2016 1435   PLT 220 06/02/2016 1435   MCV 77.7 (L) 06/02/2016 1435   MCH 25.4 (L) 06/02/2016 1435   MCHC 32.7 06/02/2016 1435   RDW 14.9 06/02/2016 1435   LYMPHSABS 2.7 05/19/2013 0131   MONOABS 0.7 05/19/2013 0131   EOSABS 0.2 05/19/2013 0131   BASOSABS 0.0 05/19/2013 0131    Medical History: Past Medical History:  Diagnosis Date  . Anginal pain ([place])    last was 1-2 months ago, on nifedepine and atenolol PRN to treat  . BPH (benign prostatic hyperplasia)   . CVA (cerebral infarction)   . Dementia    "daughter states mild  memory loss from stress"  . Depression   . Headache   . Hypertension   . MI, old    x2  . Pneumonia    3 years ago  . Stroke ([place])    in [place], small TIA 10 years ago    Surgical History: Past Surgical History:  Procedure Laterality Date  . CARDIAC CATHETERIZATION  2009  . COLONOSCOPY  2016  . TRANSURETHRAL RESECTION OF PROSTATE N/A 06/06/2016   Procedure: TRANSURETHRAL RESECTION OF THE PROSTATE (TURP);  Surgeon: Irine Seal, MD;  Location: [facility];  Service: Urology;  Laterality: N/A;    Family History: No family history on file.  Social History: Social History   Social History  . Marital status: Married    Spouse name: N/A  . Number of children: N/A  . Years of education: N/A   Occupational History  . Not on file.   Social History  Main Topics  . Smoking status: Former Smoker    Types: Cigarettes    Quit date: 06/02/2012  . Smokeless tobacco: Never Used  . Alcohol use Yes     Comment: beer on occasion   . Drug use: No  . Sexual activity: Not on file   Other Topics Concern  . Not on file   Social History Narrative  . No narrative on file    Allergies: No Known Allergies  Medications:  Current Outpatient Prescriptions  Medication Sig Dispense Refill  . atenolol (TENORMIN) 25 MG tablet Take 25 mg by mouth daily as needed (chest pain).     Marland Kitchen losartan (COZAAR) 50 MG tablet Take 25-50 mg by mouth 2 (two) times daily. 55m in the AM, 575min the PM.    . NIFEdipine (PROCARDIA-XL/ADALAT-CC/NIFEDICAL-XL) 30 MG 24 hr tablet Take 30 mg by mouth daily as needed. Takes if systolic blood pressure is above 150.    . traMADol (ULTRAM) 50 MG tablet Take 1 tablet (50 mg total) by mouth every 6 (six) hours as needed. 8 tablet 0   No current facility-administered medications for this visit.     Review of Systems: Review of Systems  All other systems reviewed and are negative.    PHYSICAL EXAMINATION Blood pressure (!) 105/47, pulse (!) 58, temperature 98.2 F  (36.8 C), temperature source Oral, resp. rate 18, height 5' 3"  (1.6 m), weight 138 lb 11.2 oz (62.9 kg), SpO2 100 %.  ECOG PERFORMANCE STATUS: 1 - Symptomatic but completely ambulatory  Physical Exam  Constitutional: He is oriented to person, place, and time and well-developed, well-nourished, and in no distress. Vital signs are normal. No distress.  HENT:  Head: Normocephalic and atraumatic.  Mouth/Throat: Oropharynx is clear and moist. No oropharyngeal exudate.  Eyes: EOM are normal. Pupils are equal, round, and reactive to light. Right eye exhibits no discharge. Left eye exhibits no discharge. No scleral icterus.  Neck: No JVD present. No tracheal deviation present. No thyromegaly present.  Cardiovascular: Normal rate, regular rhythm and normal heart sounds.   Pulmonary/Chest: Breath sounds normal. No respiratory distress. He has no wheezes.  Abdominal: Soft. He exhibits no distension. There is no tenderness. There is no guarding.  Musculoskeletal: He exhibits edema (1+ bilateral pitting).       Arms: Lymphadenopathy:       Head (right side): No submandibular and no occipital adenopathy present.       Head (left side): No submandibular and no occipital adenopathy present.    He has no cervical adenopathy.    He has no axillary adenopathy.       Right: No inguinal and no supraclavicular adenopathy present.       Left: No inguinal and no supraclavicular adenopathy present.  Neurological: He is alert and oriented to person, place, and time. He has normal reflexes. No cranial nerve deficit.  Skin: Skin is warm. No rash noted. He is not diaphoretic.     LABORATORY DATA: I have personally reviewed the data as listed: No visits with results within 1 Week(s) from this visit.  Latest known visit with results is:  Hospital Outpatient Visit on 06/02/2016  Component Date Value Ref Range Status  . Sodium 06/02/2016 136  135 - 145 mmol/L Final  . Potassium 06/02/2016 4.3  3.5 - 5.1 mmol/L  Final  . Chloride 06/02/2016 105  101 - 111 mmol/L Final  . CO2 06/02/2016 26  22 - 32 mmol/L Final  . Glucose, Bld 06/02/2016  135* 65 - 99 mg/dL Final  . BUN 06/02/2016 22* 6 - 20 mg/dL Final  . Creatinine, Ser 06/02/2016 1.50* 0.61 - 1.24 mg/dL Final  . Calcium 06/02/2016 8.3* 8.9 - 10.3 mg/dL Final  . GFR calc non Af Amer 06/02/2016 43* >60 mL/min Final  . GFR calc Af Amer 06/02/2016 50* >60 mL/min Final   Comment: (NOTE) The eGFR has been calculated using the CKD EPI equation. This calculation has not been validated in all clinical situations. eGFR's persistently <60 mL/min signify possible Chronic Kidney Disease.   . Anion gap 06/02/2016 5  5 - 15 Final  . WBC 06/02/2016 8.3  4.0 - 10.5 K/uL Final  . RBC 06/02/2016 4.76  4.22 - 5.81 MIL/uL Final  . Hemoglobin 06/02/2016 12.1* 13.0 - 17.0 g/dL Final  . HCT 06/02/2016 37.0* 39.0 - 52.0 % Final  . MCV 06/02/2016 77.7* 78.0 - 100.0 fL Final  . MCH 06/02/2016 25.4* 26.0 - 34.0 pg Final  . MCHC 06/02/2016 32.7  30.0 - 36.0 g/dL Final  . RDW 06/02/2016 14.9  11.5 - 15.5 % Final  . Platelets 06/02/2016 220  150 - 400 K/uL Final       Ardath Sax, MD

## 2016-10-20 ENCOUNTER — Other Ambulatory Visit: Payer: Self-pay | Admitting: Hematology and Oncology

## 2016-10-20 ENCOUNTER — Telehealth: Payer: Self-pay | Admitting: Medical Oncology

## 2016-10-20 ENCOUNTER — Other Ambulatory Visit: Payer: Self-pay

## 2016-10-20 ENCOUNTER — Encounter: Payer: Self-pay | Admitting: Hematology and Oncology

## 2016-10-20 ENCOUNTER — Ambulatory Visit (HOSPITAL_BASED_OUTPATIENT_CLINIC_OR_DEPARTMENT_OTHER): Payer: Medicare Other

## 2016-10-20 ENCOUNTER — Other Ambulatory Visit: Payer: Self-pay | Admitting: Medical Oncology

## 2016-10-20 ENCOUNTER — Ambulatory Visit (HOSPITAL_BASED_OUTPATIENT_CLINIC_OR_DEPARTMENT_OTHER): Payer: Medicare Other | Admitting: Hematology and Oncology

## 2016-10-20 ENCOUNTER — Telehealth: Payer: Self-pay | Admitting: Hematology and Oncology

## 2016-10-20 VITALS — BP 105/47 | HR 58 | Temp 98.2°F | Resp 18 | Ht 63.0 in | Wt 138.7 lb

## 2016-10-20 DIAGNOSIS — R768 Other specified abnormal immunological findings in serum: Secondary | ICD-10-CM

## 2016-10-20 DIAGNOSIS — C9000 Multiple myeloma not having achieved remission: Secondary | ICD-10-CM

## 2016-10-20 DIAGNOSIS — D649 Anemia, unspecified: Secondary | ICD-10-CM | POA: Diagnosis not present

## 2016-10-20 DIAGNOSIS — N183 Chronic kidney disease, stage 3 unspecified: Secondary | ICD-10-CM

## 2016-10-20 DIAGNOSIS — M7989 Other specified soft tissue disorders: Secondary | ICD-10-CM

## 2016-10-20 LAB — CBC & DIFF AND RETIC
BASO%: 0.8 % (ref 0.0–2.0)
Basophils Absolute: 0.1 10*3/uL (ref 0.0–0.1)
EOS%: 1.6 % (ref 0.0–7.0)
Eosinophils Absolute: 0.1 10*3/uL (ref 0.0–0.5)
HCT: 37.5 % — ABNORMAL LOW (ref 38.4–49.9)
HGB: 12.1 g/dL — ABNORMAL LOW (ref 13.0–17.1)
Immature Retic Fract: 3.5 % (ref 3.00–10.60)
LYMPH#: 2.5 10*3/uL (ref 0.9–3.3)
LYMPH%: 34.1 % (ref 14.0–49.0)
MCH: 26 pg — ABNORMAL LOW (ref 27.2–33.4)
MCHC: 32.3 g/dL (ref 32.0–36.0)
MCV: 80.5 fL (ref 79.3–98.0)
MONO#: 0.7 10*3/uL (ref 0.1–0.9)
MONO%: 10 % (ref 0.0–14.0)
NEUT#: 3.9 10*3/uL (ref 1.5–6.5)
NEUT%: 53.5 % (ref 39.0–75.0)
Platelets: 203 10*3/uL (ref 140–400)
RBC: 4.66 10*6/uL (ref 4.20–5.82)
RDW: 14.8 % — AB (ref 11.0–14.6)
RETIC %: 1.04 % (ref 0.80–1.80)
RETIC CT ABS: 48.46 10*3/uL (ref 34.80–93.90)
WBC: 7.4 10*3/uL (ref 4.0–10.3)

## 2016-10-20 LAB — COMPREHENSIVE METABOLIC PANEL
ALT: 28 U/L (ref 0–55)
AST: 30 U/L (ref 5–34)
Albumin: 3.3 g/dL — ABNORMAL LOW (ref 3.5–5.0)
Alkaline Phosphatase: 48 U/L (ref 40–150)
Anion Gap: 8 mEq/L (ref 3–11)
BUN: 23.5 mg/dL (ref 7.0–26.0)
CALCIUM: 9 mg/dL (ref 8.4–10.4)
CHLORIDE: 103 meq/L (ref 98–109)
CO2: 26 mEq/L (ref 22–29)
Creatinine: 1.9 mg/dL — ABNORMAL HIGH (ref 0.7–1.3)
EGFR: 33 mL/min/{1.73_m2} — ABNORMAL LOW (ref >90)
GLUCOSE: 89 mg/dL (ref 70–140)
POTASSIUM: 4 meq/L (ref 3.5–5.1)
SODIUM: 137 meq/L (ref 136–145)
Total Bilirubin: 0.3 mg/dL (ref 0.20–1.20)
Total Protein: 10.6 g/dL — ABNORMAL HIGH (ref 6.4–8.3)

## 2016-10-20 LAB — LACTATE DEHYDROGENASE: LDH: 200 U/L (ref 125–245)

## 2016-10-20 LAB — CHCC SMEAR

## 2016-10-20 LAB — TECHNOLOGIST REVIEW

## 2016-10-20 LAB — URIC ACID: Uric Acid, Serum: 9 mg/dl — ABNORMAL HIGH (ref 2.6–7.4)

## 2016-10-20 MED ORDER — LORAZEPAM 0.5 MG PO TABS: 0.5000 mg | ORAL_TABLET | Freq: Once | ORAL | 0 refills | Status: AC

## 2016-10-20 MED ORDER — MORPHINE SULFATE (CONCENTRATE) 10 MG /0.5 ML PO SOLN: 10.0000 mg | ORAL | 0 refills | Status: DC | PRN

## 2016-10-20 MED ORDER — OXYCODONE HCL 5 MG PO TABS: 5.0000 mg | ORAL_TABLET | ORAL | 0 refills | Status: DC | PRN

## 2016-10-20 MED ORDER — ALLOPURINOL 300 MG PO TABS: 300.0000 mg | ORAL_TABLET | Freq: Every day | ORAL | 3 refills | Status: DC

## 2016-10-20 MED ORDER — OXYCODONE HCL 5 MG PO TABS
5.0000 mg | ORAL_TABLET | ORAL | 0 refills | Status: DC | PRN
Start: 2016-10-20 — End: 2016-11-03

## 2016-10-20 MED ORDER — LORAZEPAM 0.5 MG PO TABS: 0.5000 mg | ORAL_TABLET | Freq: Once | ORAL | 0 refills | Status: DC

## 2016-10-20 NOTE — Telephone Encounter (Signed)
I left message for daughter to return call with phone number of granddaughter who was with pt today. It needs to be added to chart. I want to tell granddaughter that  another medication, allopurinol,  was sent to his pharmacy.

## 2016-10-20 NOTE — Telephone Encounter (Signed)
Appointments scheduled per 10/20/16 los. Lab added for today. Patient was given a copy of the AVS report and appointment schedule, per 10/20/16 los.

## 2016-10-20 NOTE — Telephone Encounter (Signed)
Pt schedule for bone marrow bx next Friday in the infusion room and cyto pathology notified and scheduled pt .

## 2016-10-21 LAB — BETA 2 MICROGLOBULIN, SERUM: BETA 2: 5.6 mg/L — AB (ref 0.6–2.4)

## 2016-10-23 LAB — KAPPA/LAMBDA LIGHT CHAINS
IG LAMBDA FREE LIGHT CHAIN: 81.7 mg/L — AB (ref 5.7–26.3)
Ig Kappa Free Light Chain: 334.4 mg/L — ABNORMAL HIGH (ref 3.3–19.4)
Kappa/Lambda FluidC Ratio: 4.09 — ABNORMAL HIGH (ref 0.26–1.65)

## 2016-10-23 LAB — VISCOSITY, SERUM: VISCOSITY, SERUM: 2.6 rel.saline — AB (ref 1.6–1.9)

## 2016-10-24 DIAGNOSIS — I129 Hypertensive chronic kidney disease with stage 1 through stage 4 chronic kidney disease, or unspecified chronic kidney disease: Secondary | ICD-10-CM | POA: Diagnosis not present

## 2016-10-24 DIAGNOSIS — D649 Anemia, unspecified: Secondary | ICD-10-CM | POA: Diagnosis not present

## 2016-10-24 DIAGNOSIS — E8809 Other disorders of plasma-protein metabolism, not elsewhere classified: Secondary | ICD-10-CM | POA: Diagnosis not present

## 2016-10-24 DIAGNOSIS — N183 Chronic kidney disease, stage 3 (moderate): Secondary | ICD-10-CM | POA: Diagnosis not present

## 2016-10-24 LAB — MULTIPLE MYELOMA PANEL, SERUM
ALBUMIN/GLOB SERPL: 0.6 — AB (ref 0.7–1.7)
Albumin SerPl Elph-Mcnc: 3.5 g/dL (ref 2.9–4.4)
Alpha 1: 0.2 g/dL (ref 0.0–0.4)
Alpha2 Glob SerPl Elph-Mcnc: 0.7 g/dL (ref 0.4–1.0)
B-GLOBULIN SERPL ELPH-MCNC: 1.1 g/dL (ref 0.7–1.3)
GAMMA GLOB SERPL ELPH-MCNC: 4.2 g/dL — AB (ref 0.4–1.8)
GLOBULIN, TOTAL: 6.1 g/dL — AB (ref 2.2–3.9)
IGA/IMMUNOGLOBULIN A, SERUM: 199 mg/dL (ref 61–437)
IgG, Qn, Serum: 3921 mg/dL — ABNORMAL HIGH (ref 700–1600)
IgM, Qn, Serum: 37 mg/dL (ref 15–143)
Total Protein: 9.6 g/dL — ABNORMAL HIGH (ref 6.0–8.5)

## 2016-10-25 DIAGNOSIS — C9000 Multiple myeloma not having achieved remission: Secondary | ICD-10-CM | POA: Diagnosis not present

## 2016-10-25 DIAGNOSIS — R768 Other specified abnormal immunological findings in serum: Secondary | ICD-10-CM | POA: Diagnosis not present

## 2016-10-27 ENCOUNTER — Ambulatory Visit (HOSPITAL_BASED_OUTPATIENT_CLINIC_OR_DEPARTMENT_OTHER): Payer: Medicare Other | Admitting: Hematology and Oncology

## 2016-10-27 ENCOUNTER — Other Ambulatory Visit (HOSPITAL_COMMUNITY)
Admission: RE | Admit: 2016-10-27 | Discharge: 2016-10-27 | Disposition: A | Discharge status: Home / Self Care | Payer: Medicare Other | Source: Ambulatory Visit | Attending: Hematology and Oncology | Admitting: Hematology and Oncology

## 2016-10-27 ENCOUNTER — Ambulatory Visit: Payer: Medicare Other

## 2016-10-27 ENCOUNTER — Ambulatory Visit (HOSPITAL_BASED_OUTPATIENT_CLINIC_OR_DEPARTMENT_OTHER): Payer: Medicare Other

## 2016-10-27 ENCOUNTER — Telehealth: Payer: Self-pay | Admitting: Hematology and Oncology

## 2016-10-27 ENCOUNTER — Encounter: Payer: Self-pay | Admitting: Hematology and Oncology

## 2016-10-27 VITALS — BP 124/61 | HR 71 | Temp 98.2°F | Resp 18

## 2016-10-27 DIAGNOSIS — E8809 Other disorders of plasma-protein metabolism, not elsewhere classified: Secondary | ICD-10-CM | POA: Insufficient documentation

## 2016-10-27 DIAGNOSIS — C9000 Multiple myeloma not having achieved remission: Secondary | ICD-10-CM

## 2016-10-27 DIAGNOSIS — D649 Anemia, unspecified: Secondary | ICD-10-CM | POA: Diagnosis not present

## 2016-10-27 DIAGNOSIS — D72822 Plasmacytosis: Secondary | ICD-10-CM | POA: Diagnosis not present

## 2016-10-27 DIAGNOSIS — R768 Other specified abnormal immunological findings in serum: Secondary | ICD-10-CM

## 2016-10-27 LAB — UPEP/UIFE/LIGHT CHAINS/TP, 24-HR UR
% BETA, URINE: 29.6 %
ALBUMIN, U: 20.2 %
ALPHA 1 URINE: 2.4 %
ALPHA-2-GLOBULIN, U: 8.7 %
Free Kappa Lt Chains,Ur: 459 mg/L — ABNORMAL HIGH (ref 1.35–24.19)
Free Lambda Lt Chains,Ur: 19.7 mg/L — ABNORMAL HIGH (ref 0.24–6.66)
GAMMA GLOBULIN URINE: 39.1 %
Kappa/Lambda Ratio,U: 23.3 — ABNORMAL HIGH (ref 2.04–10.37)
PROTEIN 24H UR: 200 mg/(24.h) — AB (ref 30–150)
PROTEIN,TOTAL,URINE: 10.5 mg/dL

## 2016-10-27 LAB — CBC WITH DIFFERENTIAL/PLATELET
BASO%: 0.8 % (ref 0.0–2.0)
BASOS ABS: 0.1 10*3/uL (ref 0.0–0.1)
EOS%: 1 % (ref 0.0–7.0)
Eosinophils Absolute: 0.1 10*3/uL (ref 0.0–0.5)
HEMATOCRIT: 39.6 % (ref 38.4–49.9)
HEMOGLOBIN: 13 g/dL (ref 13.0–17.1)
LYMPH%: 29.7 % (ref 14.0–49.0)
MCH: 26.1 pg — ABNORMAL LOW (ref 27.2–33.4)
MCHC: 32.7 g/dL (ref 32.0–36.0)
MCV: 79.8 fL (ref 79.3–98.0)
MONO#: 0.9 10*3/uL (ref 0.1–0.9)
MONO%: 11.4 % (ref 0.0–14.0)
NEUT%: 57.1 % (ref 39.0–75.0)
NEUTROS ABS: 4.5 10*3/uL (ref 1.5–6.5)
PLATELETS: 202 10*3/uL (ref 140–400)
RBC: 4.96 10*6/uL (ref 4.20–5.82)
RDW: 14.9 % — AB (ref 11.0–14.6)
WBC: 7.8 10*3/uL (ref 4.0–10.3)
lymph#: 2.3 10*3/uL (ref 0.9–3.3)

## 2016-10-27 NOTE — Progress Notes (Signed)
[facility] Follow-up Visit:  Assessment: Elevated serum immunoglobulin free light chain level 76 year old male undergoing evaluation of progressive anemia, renal insufficiency, and dysproteinemia with suspected diagnosis of multiple myeloma. Patient presented to clinic today for bone marrow aspiration and biopsy as part of the evaluation.  Prior to procedure, informed consent was obtained after the reasons for the procedure, and potential complications were discussed with the patient. Timeout procedure was conducted with confirmation of the name, date of birth, and the type of procedure and signed performed. Procedural site was cleaned with Betadine solution, draped in sterile fashion. Posterior-superior iliac spine on the right side was identified by palpation. Skin was anesthetized with injection of 1% solution of lidocaine, subsequently periosteum of the procedure site was anesthetized with the same solution after switching a needle. 3 mm incision was made in the skin using #11 blade. Biopsy needle was introduced by tactile sensation accessing the bone. Aspirate was obtained demonstrating excellent content of spicules. After slightly repositioning the needle, a core sample was obtained and submitted for pathological evaluation. Patient has tolerated procedure well without immediate complications were excessive bleeding. Blood loss minimal. 7 mL of 1% lidocaine were used on total for the local anesthesia. Dressing applied, patient discharged from the clinic.   Orders Placed This Encounter  Procedures  . CBC with Differential    Standing Status:   Future    Standing Expiration Date:   10/27/2017    Scheduling Instructions:     Part of the Bone marrow biopsy evaluation    Cancer Staging No matching staging information was found for the patient.  All questions were answered.  . The patient knows to call the clinic with any problems, questions or concerns.  This note was  electronically signed.    History of Presenting Illness Jacob Pope 76 y.o. presenting to the [facility] for undergoing bone marrow biopsy as part of evaluation for suspected multiple myeloma. Please see my note from 10/20/16 for details of original presentation.  Patient has no new complaints since last visit to the clinic.  .  Oncological/hematological History:  No history exists.    Medical History: Past Medical History:  Diagnosis Date  . Anginal pain ([place])    last was 1-2 months ago, on nifedepine and atenolol PRN to treat  . BPH (benign prostatic hyperplasia)    s/p TURP, Jan 2018  . CVA (cerebral infarction)   . Depression   . Headache   . Hypertension   . MI, old    x2  . Pneumonia    3 years ago  . Stroke [facility]) 05/2013   Precedent TIA while in [place]    Surgical History: Past Surgical History:  Procedure Laterality Date  . CARDIAC CATHETERIZATION  2009  . CATARACT EXTRACTION, BILATERAL Bilateral   . COLONOSCOPY  2016  . TRANSURETHRAL RESECTION OF PROSTATE N/A 06/06/2016   Procedure: TRANSURETHRAL RESECTION OF THE PROSTATE (TURP);  Surgeon: Irine Seal, MD;  Location: [facility];  Service: Urology;  Laterality: N/A;  . TRANSURETHRAL RESECTION OF PROSTATE  06/06/2016   Dr Jeffie Pollock    Family History: Family History  Problem Relation Age of Onset  . Cancer - Other Sister 57       Head and neck  . Cancer - Other Brother 68       At the neck  . Heart attack Sister 4  . Kidney disease Sister   . Hypertension Other     Social History: Social History  Social History  . Marital status: Married    Spouse name: N/A  . Number of children: N/A  . Years of education: N/A   Occupational History  . 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 restaurant    Social History Main Topics  . Smoking status: Former Smoker    Types: Cigarettes    Quit date: 06/02/2012  . Smokeless tobacco: Never Used  . Alcohol use Yes     Comment: beer on occasion   . Drug use: No  . Sexual  activity: Not on file   Other Topics Concern  . Not on file   Social History Narrative  . No narrative on file    Allergies: No Known Allergies  Medications:  Current Outpatient Prescriptions  Medication Sig Dispense Refill  . allopurinol (ZYLOPRIM) 300 MG tablet Take 1 tablet (300 mg total) by mouth daily. 30 tablet 3  . atenolol (TENORMIN) 25 MG tablet Take 25 mg by mouth daily as needed (chest pain).     Marland Kitchen NIFEdipine (PROCARDIA-XL/ADALAT-CC/NIFEDICAL-XL) 30 MG 24 hr tablet Take 30 mg by mouth daily as needed. Takes if systolic blood pressure is above 150.    Marland Kitchen oxyCODONE (OXY IR/ROXICODONE) 5 MG immediate release tablet Take 1 tablet (5 mg total) by mouth every 4 (four) hours as needed for severe pain. Take one tablet by mouth 30 minutes before bone marrow biopsy and after for pain 3 tablet 0   No current facility-administered medications for this visit.     Review of Systems: Review of Systems  All other systems reviewed and are negative.    PHYSICAL EXAMINATION There were no vitals taken for this visit.  ECOG PERFORMANCE STATUS: 1 - Symptomatic but completely ambulatory  Physical Exam  Constitutional:  Formal physical exam not conducted during this clinical visit.  Nursing note and vitals reviewed.    LABORATORY DATA: I have personally reviewed the data as listed: No visits with results within 1 Week(s) from this visit.  Latest known visit with results is:  Appointment on 10/20/2016  Component Date Value Ref Range Status  . WBC 10/20/2016 7.4  4.0 - 10.3 10e3/uL Final  . NEUT# 10/20/2016 3.9  1.5 - 6.5 10e3/uL Final  . HGB 10/20/2016 12.1* 13.0 - 17.1 g/dL Final  . HCT 10/20/2016 37.5* 38.4 - 49.9 % Final  . Platelets 10/20/2016 203  140 - 400 10e3/uL Final  . MCV 10/20/2016 80.5  79.3 - 98.0 fL Final  . MCH 10/20/2016 26.0* 27.2 - 33.4 pg Final  . MCHC 10/20/2016 32.3  32.0 - 36.0 g/dL Final  . RBC 10/20/2016 4.66  4.20 - 5.82 10e6/uL Final  . RDW  10/20/2016 14.8* 11.0 - 14.6 % Final  . lymph# 10/20/2016 2.5  0.9 - 3.3 10e3/uL Final  . MONO# 10/20/2016 0.7  0.1 - 0.9 10e3/uL Final  . Eosinophils Absolute 10/20/2016 0.1  0.0 - 0.5 10e3/uL Final  . Basophils Absolute 10/20/2016 0.1  0.0 - 0.1 10e3/uL Final  . NEUT% 10/20/2016 53.5  39.0 - 75.0 % Final  . LYMPH% 10/20/2016 34.1  14.0 - 49.0 % Final  . MONO% 10/20/2016 10.0  0.0 - 14.0 % Final  . EOS% 10/20/2016 1.6  0.0 - 7.0 % Final  . BASO% 10/20/2016 0.8  0.0 - 2.0 % Final  . Retic % 10/20/2016 1.04  0.80 - 1.80 % Final  . Retic Ct Abs 10/20/2016 48.46  34.80 - 93.90 10e3/uL Final  . Immature Retic Fract 10/20/2016 3.50  3.00 -  10.60 % Final  . Smear Result 10/20/2016 Smear Available   Final  . Sodium 10/20/2016 137  136 - 145 mEq/L Final  . Potassium 10/20/2016 4.0  3.5 - 5.1 mEq/L Final  . Chloride 10/20/2016 103  98 - 109 mEq/L Final  . CO2 10/20/2016 26  22 - 29 mEq/L Final  . Glucose 10/20/2016 89  70 - 140 mg/dl Final   Glucose reference range is for nonfasting patients. Fasting glucose reference range is 70- 100.  Marland Kitchen BUN 10/20/2016 23.5  7.0 - 26.0 mg/dL Final  . Creatinine 10/20/2016 1.9* 0.7 - 1.3 mg/dL Final  . Total Bilirubin 10/20/2016 0.30  0.20 - 1.20 mg/dL Final  . Alkaline Phosphatase 10/20/2016 48  40 - 150 U/L Final  . AST 10/20/2016 30  5 - 34 U/L Final  . ALT 10/20/2016 28  0 - 55 U/L Final  . Total Protein 10/20/2016 10.6* 6.4 - 8.3 g/dL Final  . Albumin 10/20/2016 3.3* 3.5 - 5.0 g/dL Final  . Calcium 10/20/2016 9.0  8.4 - 10.4 mg/dL Final  . Anion Gap 10/20/2016 8  3 - 11 mEq/L Final  . EGFR 10/20/2016 33* >90 ml/min/1.73 m2 Final   eGFR is calculated using the CKD-EPI Creatinine Equation (2009)  . LDH 10/20/2016 200  125 - 245 U/L Final  . Uric Acid, Serum 10/20/2016 9.0* 2.6 - 7.4 mg/dl Final  . Beta-2 10/20/2016 5.6* 0.6 - 2.4 mg/L Final   Siemens Immulite 2000 Immunochemiluminometric assay (ICMA)  . Ig Kappa Free Light Chain 10/20/2016 334.4* 3.3 -  19.4 mg/L Final  . Ig Lambda Free Light Chain 10/20/2016 81.7* 5.7 - 26.3 mg/L Final  . Kappa/Lambda FluidC Ratio 10/20/2016 4.09* 0.26 - 1.65 Final  . Viscosity, Serum 10/20/2016 2.6* 1.6 - 1.9 rel.saline Final   Comment: Values above 2.7 may indicate paraproteinemia is present. This test was developed and its performance characteristics determined by [facility]. It has not been cleared or approved by the Food and Drug Administration.   . IgG, Qn, Serum 10/20/2016 3,921* 700 - 1600 mg/dL Final  . IgA, Qn, Serum 10/20/2016 199  61 - 437 mg/dL Final  . IgM, Qn, Serum 10/20/2016 37  15 - 143 mg/dL Final  . Total Protein 10/20/2016 9.6* 6.0 - 8.5 g/dL Final  . Albumin SerPl Elph-Mcnc 10/20/2016 3.5  2.9 - 4.4 g/dL Final  . Alpha 1 10/20/2016 0.2  0.0 - 0.4 g/dL Final  . Alpha2 Glob SerPl Elph-Mcnc 10/20/2016 0.7  0.4 - 1.0 g/dL Final  . B-Globulin SerPl Elph-Mcnc 10/20/2016 1.1  0.7 - 1.3 g/dL Final  . Gamma Glob SerPl Elph-Mcnc 10/20/2016 4.2* 0.4 - 1.8 g/dL Final  . M Protein SerPl Elph-Mcnc 10/20/2016 Not Observed  Not Observed g/dL Final  . Globulin, Total 10/20/2016 6.1* 2.2 - 3.9 g/dL Final  . Albumin/Glob SerPl 10/20/2016 0.6* 0.7 - 1.7 Final  . 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   . Please Note 10/20/2016 Comment   Final   Comment: Protein electrophoresis scan will follow via computer, mail, or courier delivery.   . Technologist Review 10/20/2016 Variant lymphs present, rouleaux   Final       Ardath Sax, MD

## 2016-10-27 NOTE — Assessment & Plan Note (Signed)
76 year old male undergoing evaluation of progressive anemia, renal insufficiency, and dysproteinemia with suspected diagnosis of multiple myeloma. Patient presented to clinic today for bone marrow aspiration and biopsy as part of the evaluation.  Prior to procedure, informed consent was obtained after the reasons for the procedure, and potential complications were discussed with the patient. Timeout procedure was conducted with confirmation of the name, date of birth, and the type of procedure and signed performed. Procedural site was cleaned with Betadine solution, draped in sterile fashion. Posterior-superior iliac spine on the right side was identified by palpation. Skin was anesthetized with injection of 1% solution of lidocaine, subsequently periosteum of the procedure site was anesthetized with the same solution after switching a needle. 3 mm incision was made in the skin using #11 blade. Biopsy needle was introduced by tactile sensation accessing the bone. Aspirate was obtained demonstrating excellent content of spicules. After slightly repositioning the needle, a core sample was obtained and submitted for pathological evaluation. Patient has tolerated procedure well without immediate complications were excessive bleeding. Blood loss minimal. 7 mL of 1% lidocaine were used on total for the local anesthesia. Dressing applied, patient discharged from the clinic.

## 2016-10-27 NOTE — Progress Notes (Signed)
Dr. Lebron Conners at bedside [phone] to explain procedure to patient with translation. Consent form signed and dated. Bone Marrow Technician notified and procedure started at [facility]. Time out called at this time and MD, technician, and RN in agreement of the right patient, right DOB, right procedure, right reason for procedure, and right site. Pt observed taking oral pain medication as prescribed 30 minutes prior to procedure. Additional local anesthesia used to numb biopsy site. Samples collected. Procedure completion time 0840. Pt instructed to turn slowly after 5 to 10 minutes. Pt to remain in bed for 30 minutes post procedure and leave bandaid in place for 48 hours post procedure. If patient experiences any discomfort, he can apply pressure to the lower back with a towel. Dr. Lebron Conners provided pre-procedure and post-procedure instructions to patient. Pt and granddaughter verbalized understanding. VSS pt mobile and asymptomatic upon d/c.

## 2016-10-27 NOTE — Telephone Encounter (Signed)
Scheduled appt per 6/15 los. Gave patient AVS and calender.  

## 2016-11-03 ENCOUNTER — Ambulatory Visit: Payer: Medicare Other

## 2016-11-03 ENCOUNTER — Ambulatory Visit (HOSPITAL_BASED_OUTPATIENT_CLINIC_OR_DEPARTMENT_OTHER): Payer: Medicare Other | Admitting: Hematology and Oncology

## 2016-11-03 ENCOUNTER — Telehealth: Payer: Self-pay | Admitting: Hematology and Oncology

## 2016-11-03 ENCOUNTER — Encounter: Payer: Self-pay | Admitting: Hematology and Oncology

## 2016-11-03 VITALS — BP 136/64 | HR 65 | Temp 97.9°F | Resp 18 | Ht 63.0 in | Wt 137.2 lb

## 2016-11-03 DIAGNOSIS — D689 Coagulation defect, unspecified: Secondary | ICD-10-CM | POA: Diagnosis not present

## 2016-11-03 DIAGNOSIS — N289 Disorder of kidney and ureter, unspecified: Secondary | ICD-10-CM | POA: Diagnosis not present

## 2016-11-03 DIAGNOSIS — Z113 Encounter for screening for infections with a predominantly sexual mode of transmission: Secondary | ICD-10-CM | POA: Diagnosis not present

## 2016-11-03 DIAGNOSIS — N183 Chronic kidney disease, stage 3 unspecified: Secondary | ICD-10-CM

## 2016-11-03 DIAGNOSIS — D72822 Plasmacytosis: Secondary | ICD-10-CM

## 2016-11-03 DIAGNOSIS — D649 Anemia, unspecified: Secondary | ICD-10-CM | POA: Diagnosis not present

## 2016-11-03 DIAGNOSIS — R768 Other specified abnormal immunological findings in serum: Secondary | ICD-10-CM | POA: Diagnosis not present

## 2016-11-03 DIAGNOSIS — E8809 Other disorders of plasma-protein metabolism, not elsewhere classified: Secondary | ICD-10-CM | POA: Diagnosis not present

## 2016-11-03 DIAGNOSIS — D759 Disease of blood and blood-forming organs, unspecified: Secondary | ICD-10-CM

## 2016-11-03 NOTE — Assessment & Plan Note (Signed)
New presentation with gradually progressive anemia, renal insufficiency, and dysproteinemia. 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  The bone marrow biopsy was negative for evidence of a monoclonal plasma cell process. The plasma cells comprised about 10-12% of the BM cellularity, but they were polyclonal by appearance. Cytogenetics and FISH are still pending.  Our next differential here would be an autoimmune disorder based on the polyclonal nature of the gammopathy, profound fatigue, renal injury and significant proteinuria. Patient has no other signs or symptoms of inflammatory disease at this time, but additional lab work will be obtained. Additionally, certain infection such as HIV and hepatitis C can be associated with similar picture and will be investigated.  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

## 2016-11-03 NOTE — Telephone Encounter (Signed)
Lab added for today, per 11/03/16 los. Follow up scheduled for 2 weeks, per 11/03/16 los. Patient was given a copy of the AVS report and appointment schedule, per 11/03/16 los.

## 2016-11-03 NOTE — Progress Notes (Signed)
[facility] New Visit:  Assessment: Elevated serum immunoglobulin free light chain level New presentation with gradually progressive anemia, renal insufficiency, and dysproteinemia. 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  The bone marrow biopsy was negative for evidence of a monoclonal plasma cell process. The plasma cells comprised about 10-12% of the BM cellularity, but they were polyclonal by appearance. Cytogenetics and FISH are still pending.  Our next differential here would be an autoimmune disorder based on the polyclonal nature of the gammopathy, profound fatigue, renal injury and significant proteinuria. Patient has no other signs or symptoms of inflammatory disease at this time, but additional lab work will be obtained. Additionally, certain infection such as HIV and hepatitis C can be associated with similar picture and will be investigated.  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 Cardiolipin antibodies, IgG, IgM, IgA*    Standing Status:   Future    Number of Occurrences:   1    Standing Expiration Date:   11/03/2017  . Cryofibrinogen, Qualitative    Standing Status:   Future    Number of Occurrences:   1    Standing Expiration Date:   11/03/2017  . APTT    Standing Status:   Future    Number of Occurrences:   1    Standing Expiration Date:   11/03/2017  . Protime-INR    Standing Status:   Future    Number of Occurrences:   1    Standing Expiration Date:   11/03/2017  . Lupus anticoagulant panel*    Standing Status:   Future    Number of Occurrences:   1    Standing  Expiration Date:   11/03/2017  . ANA, IFA (with reflex)    Standing Status:   Future    Number of Occurrences:   1    Standing Expiration Date:   11/03/2017  . ANCA Titers    Standing Status:   Future    Number of Occurrences:   1    Standing Expiration Date:   11/03/2017  . Rheumatoid factor    Standing Status:   Future    Number of Occurrences:   1    Standing Expiration Date:   11/03/2017  . Hepatitis B surface antibody    Standing Status:   Future    Number of Occurrences:   1    Standing Expiration Date:   11/03/2017  . Hepatitis B surface antigen    Standing Status:   Future    Number of Occurrences:   1    Standing Expiration Date:   11/03/2017  . Hepatitis B core antibody, total    Standing Status:   Future    Number of Occurrences:   1    Standing Expiration Date:   11/03/2017  . Hepatitis B core antibody, IgM    Standing Status:   Future    Number of Occurrences:   1    Standing Expiration Date:   11/03/2017  . Hepatitis C antibody (reflex if positive)    Standing Status:   Future    Number of Occurrences:   1  Standing Expiration Date:   11/03/2017  . RPR    Standing Status:   Future    Number of Occurrences:   1    Standing Expiration Date:   11/03/2017  . CMV IgM    Standing Status:   Future    Number of Occurrences:   1    Standing Expiration Date:   11/03/2017  . CMV antibody, IgG (EIA)    Standing Status:   Future    Number of Occurrences:   1    Standing Expiration Date:   11/03/2017  . Epstein-Barr virus VCA, IgG    Standing Status:   Future    Number of Occurrences:   1    Standing Expiration Date:   11/03/2017  . Epstein-Barr virus VCA, IgM    Standing Status:   Future    Number of Occurrences:   1    Standing Expiration Date:   11/03/2017  . Epstein-Barr virus early D antigen antibody, IgG    Standing Status:   Future    Number of Occurrences:   1    Standing Expiration Date:   11/03/2017  . Epstein-Barr virus nuclear antigen antibody, IgG    Standing  Status:   Future    Number of Occurrences:   1    Standing Expiration Date:   11/03/2017  . Anti-DNA antibody, double-stranded    Standing Status:   Future    Number of Occurrences:   1    Standing Expiration Date:   11/03/2017  . HIV antibody (with reflex)  . Prothrombin Time (PT)    The encounter was conducted using patient's daughter as a Optometrist as patient does not speak [place]. All questions were answered.  . The patient knows to call the clinic with any problems, questions or concerns.  This note was electronically signed.    History of Presenting Illness Jacob Pope 76 y.o. 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 Please see neurological history below for details.  Patient referred due to presentation with progressive creatinine elevation which was noted in October 2017 with normal creatinines preceding that. In 2017 creatinine 1.7, April 2019 creatinine 2.9. With no evidence of proteinuria by urinalysis. Concurrent tries of total protein and dropping albumin and development of mild anemia.   Patient denies fevers, chills, night sweats. Denies unexpected weight loss. He denies significant new musculoskeletal complaints with the exception off some pain lateral to the lumbar spine on the right. He denies any radiation of the pain down until the gluteal region on the extremities. He does have some fatigability and he can use in bilateral inguinal regions, but denies any change in sensation peripheral extremities such as numbness, tingling, burning, or itching. Denies any changes in ambulation or gait. No nausea, dysphasia, abdominal discomfort, diarrhea, or constipation. He does have bilateral slight swelling in his feet which is new for him. Denies shortness of breath, chest pain, palpitations, or cough.   Oncological/hematological History: --External Labs, 05/19/13: Cr 1.00; Hgb 17.0 --External Labs, 02/02/15: Cr 1.00;  --External Labs,  02/22/16: tProt 8.6, Alb 3.9, Cr 1.27; Hgb 13.1 --External Labs, 06/02/16: Cr 1.50; WBC 8.3, Hgb 12.1, Plt 220; --External Labs, 08/22/16: tProt 9.8, Alb 3.0, Cr 2.00; Hgb 12.7 --External Labs, 09/19/16: tProt 9.2, Alb 3.5, Ca 9.0, Cr 1.69, AP 45; SPEP -- no obvious M spike; uIFE -- no apparent monoclonal protein; kappa 299.2, lambda 78.0, KLR 3.84; WBC 7.2, Hgb 12.2, MCV 77, MCH  25.6, RDW 15.3, Plt 201; Fe 55, FeSat 24%, TIBC 225(low); C3 complement 61(low), C4 complement 7(low)  --Renal US BL, 09/28/16: Normal size and echogenicity of the kidneys without evidence of mass or hydronephrosis.  CBC    Component Value Date/Time   WBC 7.8 10/27/2016 0917   WBC 8.3 06/02/2016 1435   RBC 4.96 10/27/2016 0917   RBC 4.76 06/02/2016 1435   HGB 13.0 10/27/2016 0917   HCT 39.6 10/27/2016 0917   PLT 202 10/27/2016 0917   MCV 79.8 10/27/2016 0917   MCH 26.1 (L) 10/27/2016 0917   MCH 25.4 (L) 06/02/2016 1435   MCHC 32.7 10/27/2016 0917   MCHC 32.7 06/02/2016 1435   RDW 14.9 (H) 10/27/2016 0917   LYMPHSABS 2.3 10/27/2016 0917   MONOABS 0.9 10/27/2016 0917   EOSABS 0.1 10/27/2016 0917   BASOSABS 0.1 10/27/2016 0917    Medical History: Past Medical History:  Diagnosis Date  . Anginal pain ([place])    last was 1-2 months ago, on nifedepine and atenolol PRN to treat  . BPH (benign prostatic hyperplasia)    s/p TURP, Jan 2018  . CVA (cerebral infarction)   . Depression   . Headache   . Hypertension   . MI, old    x2  . Pneumonia    3 years ago  . Stroke [facility]) 05/2013   Precedent TIA while in [place]    Surgical History: Past Surgical History:  Procedure Laterality Date  . CARDIAC CATHETERIZATION  2009  . CATARACT EXTRACTION, BILATERAL Bilateral   . COLONOSCOPY  2016  . TRANSURETHRAL RESECTION OF PROSTATE N/A 06/06/2016   Procedure: TRANSURETHRAL RESECTION OF THE PROSTATE (TURP);  Surgeon: Irine Seal, MD;  Location: [facility];  Service: Urology;  Laterality: N/A;  . TRANSURETHRAL  RESECTION OF PROSTATE  06/06/2016   Dr Jeffie Pollock    Family History: Family History  Problem Relation Age of Onset  . Cancer - Other Sister 1       Head and neck  . Cancer - Other Brother 68       At the neck  . Heart attack Sister 48  . Kidney disease Sister   . Hypertension Other     Social History: Social History   Social History  . Marital status: Married    Spouse name: N/A  . Number of children: N/A  . Years of education: N/A   Occupational History  . 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 restaurant    Social History Main Topics  . Smoking status: Former Smoker    Types: Cigarettes    Quit date: 06/02/2012  . Smokeless tobacco: Never Used  . Alcohol use Yes     Comment: beer on occasion   . Drug use: No  . Sexual activity: Not on file   Other Topics Concern  . Not on file   Social History Narrative  . No narrative on file    Allergies: No Known Allergies  Medications:  Current Outpatient Prescriptions  Medication Sig Dispense Refill  . allopurinol (ZYLOPRIM) 300 MG tablet Take 1 tablet (300 mg total) by mouth daily. 30 tablet 3  . atenolol (TENORMIN) 25 MG tablet Take 25 mg by mouth daily as needed (chest pain).     Marland Kitchen NIFEdipine (PROCARDIA-XL/ADALAT-CC/NIFEDICAL-XL) 30 MG 24 hr tablet Take 30 mg by mouth daily as needed. Takes if systolic blood pressure is above 150.     No current facility-administered medications for this visit.     Review of  Systems: Review of Systems  Constitutional: Positive for appetite change and fatigue. Negative for chills, diaphoresis and fever.  HENT:   Negative for lump/mass, mouth sores, nosebleeds, sore throat, tinnitus, trouble swallowing and voice change.   Eyes: Negative for eye problems and icterus.  Respiratory: Negative for cough, hemoptysis, shortness of breath and wheezing.   Cardiovascular: Positive for leg swelling. Negative for chest pain and palpitations.  Gastrointestinal: Negative for abdominal distention,  abdominal pain, blood in stool, constipation, diarrhea, nausea and vomiting.  Genitourinary: Negative for difficulty urinating and hematuria.   Musculoskeletal: Negative for arthralgias, back pain, flank pain, myalgias, neck pain and neck stiffness.  Skin: Negative for itching and rash.  Neurological: Negative for dizziness, extremity weakness and headaches.  Hematological: Negative for adenopathy.  All other systems reviewed and are negative.    PHYSICAL EXAMINATION Blood pressure 136/64, pulse 65, temperature 97.9 F (36.6 C), temperature source Oral, resp. rate 18, height 5' 3"  (1.6 m), weight 137 lb 3.2 oz (62.2 kg), SpO2 99 %.  ECOG PERFORMANCE STATUS: 1 - Symptomatic but completely ambulatory  Physical Exam  Constitutional: He is oriented to person, place, and time and well-developed, well-nourished, and in no distress. Vital signs are normal. No distress.  HENT:  Head: Normocephalic and atraumatic.  Mouth/Throat: Oropharynx is clear and moist. No oropharyngeal exudate.  Eyes: EOM are normal. Pupils are equal, round, and reactive to light. Right eye exhibits no discharge. Left eye exhibits no discharge. No scleral icterus.  Neck: No JVD present. No tracheal deviation present. No thyromegaly present.  Cardiovascular: Normal rate, regular rhythm and normal heart sounds.   Pulmonary/Chest: Breath sounds normal. No respiratory distress. He has no wheezes.  Abdominal: Soft. He exhibits no distension. There is no tenderness. There is no guarding.  Musculoskeletal: He exhibits edema (1+ bilateral pitting).       Arms: Lymphadenopathy:       Head (right side): No submandibular and no occipital adenopathy present.       Head (left side): No submandibular and no occipital adenopathy present.    He has no cervical adenopathy.    He has no axillary adenopathy.       Right: No inguinal and no supraclavicular adenopathy present.       Left: No inguinal and no supraclavicular adenopathy  present.  Neurological: He is alert and oriented to person, place, and time. He has normal reflexes. No cranial nerve deficit.  Skin: Skin is warm. No rash noted. He is not diaphoretic.     LABORATORY DATA: I have personally reviewed the data as listed: No visits with results within 1 Week(s) from this visit.  Latest known visit with results is:  Appointment on 10/27/2016  Component Date Value Ref Range Status  . WBC 10/27/2016 7.8  4.0 - 10.3 10e3/uL Final  . NEUT# 10/27/2016 4.5  1.5 - 6.5 10e3/uL Final  . HGB 10/27/2016 13.0  13.0 - 17.1 g/dL Final  . HCT 10/27/2016 39.6  38.4 - 49.9 % Final  . Platelets 10/27/2016 202  140 - 400 10e3/uL Final  . MCV 10/27/2016 79.8  79.3 - 98.0 fL Final  . MCH 10/27/2016 26.1* 27.2 - 33.4 pg Final  . MCHC 10/27/2016 32.7  32.0 - 36.0 g/dL Final  . RBC 10/27/2016 4.96  4.20 - 5.82 10e6/uL Final  . RDW 10/27/2016 14.9* 11.0 - 14.6 % Final  . lymph# 10/27/2016 2.3  0.9 - 3.3 10e3/uL Final  . MONO# 10/27/2016 0.9  0.1 - 0.9 10e3/uL  Final  . Eosinophils Absolute 10/27/2016 0.1  0.0 - 0.5 10e3/uL Final  . Basophils Absolute 10/27/2016 0.1  0.0 - 0.1 10e3/uL Final  . NEUT% 10/27/2016 57.1  39.0 - 75.0 % Final  . LYMPH% 10/27/2016 29.7  14.0 - 49.0 % Final  . MONO% 10/27/2016 11.4  0.0 - 14.0 % Final  . EOS% 10/27/2016 1.0  0.0 - 7.0 % Final  . BASO% 10/27/2016 0.8  0.0 - 2.0 % Final       Ardath Sax, MD

## 2016-11-04 LAB — LUPUS ANTICOAGULANT PANEL
DRVVT: 35.7 s (ref 0.0–47.0)
PTT-LA: 29.8 s (ref 0.0–51.9)

## 2016-11-04 LAB — HEPATITIS B CORE ANTIBODY, IGM: Hep B Core Ab, IgM: NEGATIVE

## 2016-11-04 LAB — HEPATITIS B SURFACE ANTIGEN: HEP B S AG: NEGATIVE

## 2016-11-04 LAB — EPSTEIN-BARR VIRUS VCA, IGM: EBV VCA IgM: <36 U/mL (ref 0.0–35.9)

## 2016-11-04 LAB — RHEUMATOID FACTOR: RA Latex Turbid.: <10 IU/mL (ref 0.0–13.9)

## 2016-11-04 LAB — APTT: APTT: 26 s (ref 24–33)

## 2016-11-04 LAB — PROTHROMBIN TIME (PT)
INR: 1 (ref 0.8–1.2)
Prothrombin Time: 10.7 s (ref 9.1–12.0)

## 2016-11-04 LAB — EPSTEIN-BARR VIRUS NUCLEAR ANTIGEN ANTIBODY, IGG: EBV NA IgG: >600 U/mL — ABNORMAL HIGH (ref 0.0–17.9)

## 2016-11-04 LAB — HEPATITIS B CORE ANTIBODY, TOTAL: HEP B C TOTAL AB: POSITIVE — AB

## 2016-11-04 LAB — CMV IGM: CMV IgM Ser EIA-aCnc: <30 AU/mL (ref 0.0–29.9)

## 2016-11-04 LAB — HEPATITIS C ANTIBODY (REFLEX)

## 2016-11-04 LAB — EPSTEIN-BARR VIRUS VCA, IGG: EBV VCA IgG: >600 U/mL — ABNORMAL HIGH (ref 0.0–17.9)

## 2016-11-04 LAB — HEPATITIS B SURFACE ANTIBODY,QUALITATIVE: Hep B Surface Ab, Qual: REACTIVE

## 2016-11-04 LAB — ANTI-DNA ANTIBODY, DOUBLE-STRANDED: DSDNA AB: 2 [IU]/mL (ref 0–9)

## 2016-11-04 LAB — RPR: RPR: NONREACTIVE

## 2016-11-04 LAB — HIV ANTIBODY (ROUTINE TESTING W REFLEX): HIV Screen 4th Generation wRfx: NONREACTIVE

## 2016-11-04 LAB — CMV ANTIBODY, IGG (EIA): CMV Ab - IgG: 2.5 U/mL — ABNORMAL HIGH (ref 0.00–0.59)

## 2016-11-05 LAB — EPSTEIN-BARR VIRUS EARLY D ANTIGEN ANTIBODY, IGG: EBV EARLY ANTIGEN AB, IGG: 11.9 U/mL — AB (ref 0.0–8.9)

## 2016-11-06 LAB — CARDIOLIPIN ANTIBODIES, IGG, IGM, IGA
Anticardiolipin Ab,IgG,Qn: <9 GPL U/mL (ref 0–14)
Anticardiolipin Ab,IgM,Qn: <9 MPL U/mL (ref 0–12)

## 2016-11-06 LAB — ANTINUCLEAR ANTIBODIES, IFA: ANA Titer 1: NEGATIVE

## 2016-11-07 ENCOUNTER — Ambulatory Visit (HOSPITAL_COMMUNITY)
Admission: RE | Admit: 2016-11-07 | Discharge: 2016-11-07 | Disposition: A | Discharge status: Home / Self Care | Payer: Medicare Other | Source: Ambulatory Visit | Attending: Hematology and Oncology | Admitting: Hematology and Oncology

## 2016-11-07 ENCOUNTER — Telehealth: Payer: Self-pay

## 2016-11-07 DIAGNOSIS — N183 Chronic kidney disease, stage 3 unspecified: Secondary | ICD-10-CM

## 2016-11-07 DIAGNOSIS — I700 Atherosclerosis of aorta: Secondary | ICD-10-CM | POA: Diagnosis not present

## 2016-11-07 DIAGNOSIS — C9000 Multiple myeloma not having achieved remission: Secondary | ICD-10-CM

## 2016-11-07 DIAGNOSIS — R768 Other specified abnormal immunological findings in serum: Secondary | ICD-10-CM

## 2016-11-07 DIAGNOSIS — I517 Cardiomegaly: Secondary | ICD-10-CM | POA: Insufficient documentation

## 2016-11-07 DIAGNOSIS — N400 Enlarged prostate without lower urinary tract symptoms: Secondary | ICD-10-CM | POA: Diagnosis not present

## 2016-11-07 DIAGNOSIS — R590 Localized enlarged lymph nodes: Secondary | ICD-10-CM | POA: Diagnosis not present

## 2016-11-07 DIAGNOSIS — D649 Anemia, unspecified: Secondary | ICD-10-CM

## 2016-11-07 DIAGNOSIS — K429 Umbilical hernia without obstruction or gangrene: Secondary | ICD-10-CM | POA: Insufficient documentation

## 2016-11-07 DIAGNOSIS — M7989 Other specified soft tissue disorders: Secondary | ICD-10-CM

## 2016-11-07 LAB — ANCA TITERS: Cytoplasmic (C-ANCA): <1:20 {titer}

## 2016-11-07 LAB — GLUCOSE, CAPILLARY: GLUCOSE-CAPILLARY: 97 mg/dL (ref 65–99)

## 2016-11-07 MED ORDER — FLUDEOXYGLUCOSE F - 18 (FDG) INJECTION
6.8700 | Freq: Once | INTRAVENOUS | Status: AC | PRN
  Administered 2016-11-07: 6.87 via INTRAVENOUS

## 2016-11-07 NOTE — Telephone Encounter (Signed)
Pt does not speak [place]. Spoke with daughter to explain that Dr. Lebron Conners would like for pt to see [facility] for biopsy of lymphnode due to scans showing multiple enlarged lymphnodes. [facility] to call pt to schedule appt.

## 2016-11-07 NOTE — Addendum Note (Signed)
Addended by: Ardath Sax on: 11/07/2016 04:37 PM   Modules accepted: Orders

## 2016-11-09 ENCOUNTER — Telehealth: Payer: Self-pay

## 2016-11-09 ENCOUNTER — Encounter (HOSPITAL_COMMUNITY): Payer: Self-pay

## 2016-11-09 LAB — CRYOFIBRINOGEN, QUALITATIVE

## 2016-11-09 NOTE — Telephone Encounter (Signed)
Spoke with pt's daughter. Dr. Lebron Conners would like to postpone next pt visit until after results from biopsy have been completed. Pt would be okay with this changed. Schedulers notified and pt should be called to reschedule.

## 2016-11-13 ENCOUNTER — Encounter (HOSPITAL_COMMUNITY): Payer: Self-pay

## 2016-11-13 ENCOUNTER — Telehealth: Payer: Self-pay | Admitting: Hematology and Oncology

## 2016-11-13 NOTE — Telephone Encounter (Signed)
Scheduled appt per 6/28 sch message patient is aware of appt date and time.

## 2016-11-17 ENCOUNTER — Ambulatory Visit: Payer: Medicare Other | Admitting: Hematology and Oncology

## 2016-11-17 LAB — TISSUE HYBRIDIZATION (BONE MARROW)-NCBH

## 2016-11-17 LAB — CHROMOSOME ANALYSIS, BONE MARROW

## 2016-11-23 ENCOUNTER — Telehealth: Payer: Self-pay

## 2016-11-23 NOTE — Telephone Encounter (Signed)
Referral faxed to [facility] for lymphnode biopsy from axillary or inguinal site. Documents included patient demographics, referral, diagnosis, last office note, and labs. Confirmed fax receipt.

## 2016-12-06 DIAGNOSIS — R591 Generalized enlarged lymph nodes: Secondary | ICD-10-CM | POA: Diagnosis not present

## 2016-12-13 ENCOUNTER — Ambulatory Visit (HOSPITAL_BASED_OUTPATIENT_CLINIC_OR_DEPARTMENT_OTHER): Payer: Medicare Other | Admitting: Hematology and Oncology

## 2016-12-13 ENCOUNTER — Telehealth: Payer: Self-pay | Admitting: Hematology and Oncology

## 2016-12-13 ENCOUNTER — Ambulatory Visit (HOSPITAL_BASED_OUTPATIENT_CLINIC_OR_DEPARTMENT_OTHER): Payer: Medicare Other

## 2016-12-13 VITALS — BP 131/57 | HR 62 | Temp 98.3°F | Resp 17 | Ht 63.0 in | Wt 135.9 lb

## 2016-12-13 DIAGNOSIS — R768 Other specified abnormal immunological findings in serum: Secondary | ICD-10-CM

## 2016-12-13 DIAGNOSIS — R591 Generalized enlarged lymph nodes: Secondary | ICD-10-CM | POA: Insufficient documentation

## 2016-12-13 LAB — CBC WITH DIFFERENTIAL/PLATELET
BASO%: 0.6 % (ref 0.0–2.0)
Basophils Absolute: 0.1 10*3/uL (ref 0.0–0.1)
EOS%: 1.1 % (ref 0.0–7.0)
Eosinophils Absolute: 0.1 10*3/uL (ref 0.0–0.5)
HCT: 37.5 % — ABNORMAL LOW (ref 38.4–49.9)
HGB: 12.1 g/dL — ABNORMAL LOW (ref 13.0–17.1)
LYMPH#: 4.1 10*3/uL — AB (ref 0.9–3.3)
LYMPH%: 40.8 % (ref 14.0–49.0)
MCH: 25.9 pg — AB (ref 27.2–33.4)
MCHC: 32.3 g/dL (ref 32.0–36.0)
MCV: 80.3 fL (ref 79.3–98.0)
MONO#: 0.8 10*3/uL (ref 0.1–0.9)
MONO%: 8.1 % (ref 0.0–14.0)
NEUT#: 5 10*3/uL (ref 1.5–6.5)
NEUT%: 49.4 % (ref 39.0–75.0)
Platelets: 253 10*3/uL (ref 140–400)
RBC: 4.67 10*6/uL (ref 4.20–5.82)
RDW: 14.5 % (ref 11.0–14.6)
WBC: 10.1 10*3/uL (ref 4.0–10.3)
nRBC: 0 % (ref 0–0)

## 2016-12-13 LAB — COMPREHENSIVE METABOLIC PANEL
ALT: 30 U/L (ref 0–55)
AST: 23 U/L (ref 5–34)
Albumin: 3.2 g/dL — ABNORMAL LOW (ref 3.5–5.0)
Alkaline Phosphatase: 75 U/L (ref 40–150)
Anion Gap: 10 mEq/L (ref 3–11)
BUN: 18.1 mg/dL (ref 7.0–26.0)
CHLORIDE: 101 meq/L (ref 98–109)
CO2: 26 mEq/L (ref 22–29)
Calcium: 9.1 mg/dL (ref 8.4–10.4)
Creatinine: 1.5 mg/dL — ABNORMAL HIGH (ref 0.7–1.3)
EGFR: 44 mL/min/{1.73_m2} — ABNORMAL LOW (ref >90)
GLUCOSE: 76 mg/dL (ref 70–140)
POTASSIUM: 3.9 meq/L (ref 3.5–5.1)
SODIUM: 137 meq/L (ref 136–145)
Total Bilirubin: 0.3 mg/dL (ref 0.20–1.20)
Total Protein: 9.6 g/dL — ABNORMAL HIGH (ref 6.4–8.3)

## 2016-12-13 LAB — URIC ACID: URIC ACID, SERUM: 7.6 mg/dL — AB (ref 2.6–7.4)

## 2016-12-13 NOTE — Progress Notes (Signed)
[facility] New Visit:  Assessment: Lymphadenopathy 76 year old male with presentation with gradually progressive anemia, renal insufficiency, and dysproteinemia. 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 The bone marrow biopsy was negative for evidence of a monoclonal plasma cell process. The plasma cells comprised about 10-12% of the BM cellularity, but they were polyclonal by appearance.   Our next differential here would be an autoimmune disorder based on the polyclonal nature of the gammopathy, profound fatigue, renal injury and significant proteinuria. Patient has no other signs or symptoms of inflammatory disease at this time, but additional lab work will be obtained. Additionally, certain infection such as HIV and hepatitis C can be associated with similar picture and will be investigated. Additional evaluation included labs for the above with all returning back negative. Thing positive for multiple EBV antibodies including early antigen antibodies, but IgM was negative dictating previous infection instead of the acute infection at this time.   The most concerning finding at this time is diffuse hypermetabolic lymphadenopathy. This involves almost every basin of lymph nodes. May be consistent with diffuse infection such as histoplasmosis versus diffuse lymphoid malignancy such as lymphoma. Obtaining good quality biopsy of one of the lymph nodes as critical at this point in time. Surgical  biopsy wasn't possible due to technical difficulties.  Plan: --Recheck renal function and hematological profile today --Consult interventional radiology for CT or ultrasound-guided core biopsy of a lymph node of their choosing. --Return to my clinic 1 week following the biopsy to review the  findings.  Voice recognition software was used and creation of this note. Despite my best effort at editing the text, some misspelling/errors may have occurred.   Orders Placed This Encounter  Procedures  . CT BIOPSY    Standing Status:   Future    Standing Expiration Date:   03/15/2018    Order Specific Question:   Lab orders requested (DO NOT place separate lab orders, these will be automatically ordered during procedure specimen collection):    Answer:   Cytology - Non Pap    Order Specific Question:   Lab orders requested (DO NOT place separate lab orders, these will be automatically ordered during procedure specimen collection):    Answer:   Surgical Pathology    Order Specific Question:   Reason for Exam (SYMPTOM  OR DIAGNOSIS REQUIRED)    Answer:   Diffuse lymphadenopathy with hypermetabolic parents on PET/CT. Previous evaluation nondiagnostic, reveals were unable to excisional biopsy due to lack of palpable lymphadenopathy.    Order Specific Question:   Preferred imaging location?    Answer:   [facility]    Order Specific Question:   Radiology Contrast Protocol - do NOT remove file path    Answer:   [email or web address]  . CBC with Differential    Standing Status:   Future    Number of Occurrences:   1    Standing Expiration Date:   12/13/2017  . Comprehensive metabolic panel    Standing Status:   Future    Number of Occurrences:   1    Standing Expiration Date:   12/13/2017  . Uric acid    Standing Status:   Future    Number of Occurrences:   1    Standing Expiration Date:   12/13/2017  . Ambulatory referral to Interventional Radiology  Referral Priority:   Urgent    Referral Type:   Consultation    Referral Reason:   Specialty Services Required    Requested Specialty:   Interventional Radiology    Number of Visits Requested:   1    The encounter was conducted using patient's daughter as a Optometrist as patient does not speak [place]. All  questions were answered.  . The patient knows to call the clinic with any problems, questions or concerns.  This note was electronically signed.    History of Presenting Illness Jacob Pope 76 y.o. followed in the 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 Please see neurological history below for details.  Patient referred due to presentation with progressive creatinine elevation which was noted in October 2017 with normal creatinines preceding that. In 2017 creatinine 1.7, April 2019 creatinine 2.9. With no evidence of proteinuria by urinalysis. Concurrent tries of total protein and dropping albumin and development of mild anemia.   Patient denies fevers, chills, night sweats. Denies unexpected weight loss. He denies significant new musculoskeletal complaints with the exception off some pain lateral to the lumbar spine on the right. He denies any radiation of the pain down until the gluteal region on the extremities. He does have some fatigability and he can use in bilateral inguinal regions, but denies any change in sensation peripheral extremities such as numbness, tingling, burning, or itching. Denies any changes in ambulation or gait. No nausea, dysphasia, abdominal discomfort, diarrhea, or constipation. He does have bilateral slight swelling in his feet which is new for him. Denies shortness of breath, chest pain, palpitations, or cough.  Since the last visit to the clinic, patient denies any progressive symptoms. Continues to have weakness and fatigue. She was supposed to undergo an excisional surgical biopsy, but no palpable lymphadenopathy was discovered on examination and biopsy was aborted.   Oncological/hematological History: --External Labs, 05/19/13: Cr 1.00; Hgb 17.0 --External Labs, 02/02/15: Cr 1.00;  --External Labs, 02/22/16: tProt 8.6, Alb 3.9, Cr 1.27; Hgb 13.1 --External Labs, 06/02/16: Cr 1.50; WBC 8.3, Hgb 12.1, Plt  220; --External Labs, 08/22/16: tProt 9.8, Alb 3.0, Cr 2.00; Hgb 12.7 --External Labs, 09/19/16: tProt 9.2, Alb 3.5, Ca 9.0, Cr 1.69, AP 45; SPEP -- no obvious M spike; uIFE -- no apparent monoclonal protein; kappa 299.2, lambda 78.0, KLR 3.84; WBC 7.2, Hgb 12.2, MCV 77, MCH 25.6, RDW 15.3, Plt 201; Fe 55, FeSat 24%, TIBC 225(low); C3 complement 61(low), C4 complement 7(low)  --Renal US BL, 09/28/16: Normal size and echogenicity of the kidneys without evidence of mass or hydronephrosis. --Labs, 10/20/16: tProt 10.6, Alb 3.3, Ca 9.0, Cr 1.9, AP 48; SPEP -- no apparent monoclonal protein; IgA 199, IgG 3921, IgM 37; kappa 334.4, lambda 81.7, KLR 4.09; LDH 200, sViscosity 2.6; WBC 7.4, Hgb 12.1, Plt 203;  --Labs, 11/03/16: ANA negative; No evidence of lupus anticoagulant presence, Anticardiolipin antibodies negative, DRVVT --  Negative; cryofibrinogen -- negative at 72 hours; negative for RPR, AV IgM, VCA IgM, otitis be service antigen, hepatitis B core IgM antibodies, HIV screen, HCV infection. --PET-CT, 13/24/40: Hypermetabolic lymphadenopathy in the axillary, subpectoral, right paratracheal, subcarinal, bilateral hilar, bilateral infrahilar, porta hepatis, peripancreatic,. Aortic, common iliac, external iliac, and inguinal locations. SUV max of 8.8 reported in the left external iliac lymph nodes. Other locations demonstrated similar or lower SUV activity.    CBC    Component Value Date/Time   WBC 10.1 12/13/2016 1543   WBC 8.3  06/02/2016 1435   RBC 4.67 12/13/2016 1543   RBC 4.76 06/02/2016 1435   HGB 12.1 (L) 12/13/2016 1543   HCT 37.5 (L) 12/13/2016 1543   PLT 253 12/13/2016 1543   MCV 80.3 12/13/2016 1543   MCH 25.9 (L) 12/13/2016 1543   MCH 25.4 (L) 06/02/2016 1435   MCHC 32.3 12/13/2016 1543   MCHC 32.7 06/02/2016 1435   RDW 14.5 12/13/2016 1543   LYMPHSABS 4.1 (H) 12/13/2016 1543   MONOABS 0.8 12/13/2016 1543   EOSABS 0.1 12/13/2016 1543   BASOSABS 0.1 12/13/2016 1543    Medical  History: Past Medical History:  Diagnosis Date  . Anginal pain ([place])    last was 1-2 months ago, on nifedepine and atenolol PRN to treat  . BPH (benign prostatic hyperplasia)    s/p TURP, Jan 2018  . CVA (cerebral infarction)   . Depression   . Headache   . Hypertension   . MI, old    x2  . Pneumonia    3 years ago  . Stroke [facility]) 05/2013   Precedent TIA while in [place]    Surgical History: Past Surgical History:  Procedure Laterality Date  . CARDIAC CATHETERIZATION  2009  . CATARACT EXTRACTION, BILATERAL Bilateral   . COLONOSCOPY  2016  . TRANSURETHRAL RESECTION OF PROSTATE N/A 06/06/2016   Procedure: TRANSURETHRAL RESECTION OF THE PROSTATE (TURP);  Surgeon: Irine Seal, MD;  Location: [facility];  Service: Urology;  Laterality: N/A;  . TRANSURETHRAL RESECTION OF PROSTATE  06/06/2016   Dr Jeffie Pollock    Family History: Family History  Problem Relation Age of Onset  . Cancer - Other Sister 71       Head and neck  . Cancer - Other Brother 68       At the neck  . Heart attack Sister 61  . Kidney disease Sister   . Hypertension Other     Social History: Social History   Social History  . Marital status: Married    Spouse name: N/A  . Number of children: N/A  . Years of education: N/A   Occupational History  . 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 restaurant    Social History Main Topics  . Smoking status: Former Smoker    Types: Cigarettes    Quit date: 06/02/2012  . Smokeless tobacco: Never Used  . Alcohol use Yes     Comment: beer on occasion   . Drug use: No  . Sexual activity: Not on file   Other Topics Concern  . Not on file   Social History Narrative  . No narrative on file    Allergies: No Known Allergies  Medications:  Current Outpatient Prescriptions  Medication Sig Dispense Refill  . allopurinol (ZYLOPRIM) 300 MG tablet Take 1 tablet (300 mg total) by mouth daily. 30 tablet 3  . atenolol (TENORMIN) 25 MG tablet Take 25 mg by mouth daily as needed  (chest pain).     Marland Kitchen NIFEdipine (PROCARDIA-XL/ADALAT-CC/NIFEDICAL-XL) 30 MG 24 hr tablet Take 30 mg by mouth daily as needed. Takes if systolic blood pressure is above 150.     No current facility-administered medications for this visit.     Review of Systems: Review of Systems  Constitutional: Positive for appetite change and fatigue. Negative for chills, diaphoresis and fever.  HENT:   Negative for lump/mass, mouth sores, nosebleeds, sore throat, tinnitus, trouble swallowing and voice change.   Eyes: Negative for eye problems and icterus.  Respiratory: Negative for cough,  hemoptysis, shortness of breath and wheezing.   Cardiovascular: Positive for leg swelling. Negative for chest pain and palpitations.  Gastrointestinal: Negative for abdominal distention, abdominal pain, blood in stool, constipation, diarrhea, nausea and vomiting.  Genitourinary: Negative for difficulty urinating and hematuria.   Musculoskeletal: Negative for arthralgias, back pain, flank pain, myalgias, neck pain and neck stiffness.  Skin: Negative for itching and rash.  Neurological: Negative for dizziness, extremity weakness and headaches.  Hematological: Negative for adenopathy.  All other systems reviewed and are negative.    PHYSICAL EXAMINATION Blood pressure (!) 131/57, pulse 62, temperature 98.3 F (36.8 C), temperature source Oral, resp. rate 17, height 5' 3"  (1.6 m), weight 135 lb 14.4 oz (61.6 kg), SpO2 99 %.  ECOG PERFORMANCE STATUS: 1 - Symptomatic but completely ambulatory  Physical Exam  Constitutional: He is oriented to person, place, and time and well-developed, well-nourished, and in no distress. Vital signs are normal. No distress.  HENT:  Head: Normocephalic and atraumatic.  Mouth/Throat: Oropharynx is clear and moist. No oropharyngeal exudate.  Eyes: Pupils are equal, round, and reactive to light. EOM are normal. Right eye exhibits no discharge. Left eye exhibits no discharge. No scleral  icterus.  Neck: No JVD present. No tracheal deviation present. No thyromegaly present.  Cardiovascular: Normal rate, regular rhythm and normal heart sounds.   Pulmonary/Chest: Breath sounds normal. No respiratory distress. He has no wheezes.  Abdominal: Soft. He exhibits no distension. There is no tenderness. There is no guarding.  Musculoskeletal: He exhibits edema (1+ bilateral pitting).       Arms: Lymphadenopathy:       Head (right side): No submandibular and no occipital adenopathy present.       Head (left side): No submandibular and no occipital adenopathy present.    He has no cervical adenopathy.    He has no axillary adenopathy.       Right: No inguinal and no supraclavicular adenopathy present.       Left: No inguinal and no supraclavicular adenopathy present.  Neurological: He is alert and oriented to person, place, and time. He has normal reflexes. No cranial nerve deficit.  Skin: Skin is warm. No rash noted. He is not diaphoretic.     LABORATORY DATA: I have personally reviewed the data as listed: Appointment on 12/13/2016  Component Date Value Ref Range Status  . WBC 12/13/2016 10.1  4.0 - 10.3 10e3/uL Final  . NEUT# 12/13/2016 5.0  1.5 - 6.5 10e3/uL Final  . HGB 12/13/2016 12.1* 13.0 - 17.1 g/dL Final  . HCT 12/13/2016 37.5* 38.4 - 49.9 % Final  . Platelets 12/13/2016 253  140 - 400 10e3/uL Final  . MCV 12/13/2016 80.3  79.3 - 98.0 fL Final  . MCH 12/13/2016 25.9* 27.2 - 33.4 pg Final  . MCHC 12/13/2016 32.3  32.0 - 36.0 g/dL Final  . RBC 12/13/2016 4.67  4.20 - 5.82 10e6/uL Final  . RDW 12/13/2016 14.5  11.0 - 14.6 % Final  . lymph# 12/13/2016 4.1* 0.9 - 3.3 10e3/uL Final  . MONO# 12/13/2016 0.8  0.1 - 0.9 10e3/uL Final  . Eosinophils Absolute 12/13/2016 0.1  0.0 - 0.5 10e3/uL Final  . Basophils Absolute 12/13/2016 0.1  0.0 - 0.1 10e3/uL Final  . NEUT% 12/13/2016 49.4  39.0 - 75.0 % Final  . LYMPH% 12/13/2016 40.8  14.0 - 49.0 % Final  . MONO% 12/13/2016 8.1   0.0 - 14.0 % Final  . EOS% 12/13/2016 1.1  0.0 - 7.0 % Final  .  BASO% 12/13/2016 0.6  0.0 - 2.0 % Final  . nRBC 12/13/2016 0  0 - 0 % Final  . Sodium 12/13/2016 137  136 - 145 mEq/L Final  . Potassium 12/13/2016 3.9  3.5 - 5.1 mEq/L Final  . Chloride 12/13/2016 101  98 - 109 mEq/L Final  . CO2 12/13/2016 26  22 - 29 mEq/L Final  . Glucose 12/13/2016 76  70 - 140 mg/dl Final   Glucose reference range is for nonfasting patients. Fasting glucose reference range is 70- 100.  Marland Kitchen BUN 12/13/2016 18.1  7.0 - 26.0 mg/dL Final  . Creatinine 12/13/2016 1.5* 0.7 - 1.3 mg/dL Final  . Total Bilirubin 12/13/2016 0.30  0.20 - 1.20 mg/dL Final  . Alkaline Phosphatase 12/13/2016 75  40 - 150 U/L Final  . AST 12/13/2016 23  5 - 34 U/L Final  . ALT 12/13/2016 30  0 - 55 U/L Final  . Total Protein 12/13/2016 9.6* 6.4 - 8.3 g/dL Final  . Albumin 12/13/2016 3.2* 3.5 - 5.0 g/dL Final  . Calcium 12/13/2016 9.1  8.4 - 10.4 mg/dL Final  . Anion Gap 12/13/2016 10  3 - 11 mEq/L Final  . EGFR 12/13/2016 44* >90 ml/min/1.73 m2 Final   eGFR is calculated using the CKD-EPI Creatinine Equation (2009)  . Uric Acid, Serum 12/13/2016 7.6* 2.6 - 7.4 mg/dl Final       Ardath Sax, MD

## 2016-12-13 NOTE — Assessment & Plan Note (Signed)
76 year old male with presentation with gradually progressive anemia, renal insufficiency, and dysproteinemia. 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 The bone marrow biopsy was negative for evidence of a monoclonal plasma cell process. The plasma cells comprised about 10-12% of the BM cellularity, but they were polyclonal by appearance.   Our next differential here would be an autoimmune disorder based on the polyclonal nature of the gammopathy, profound fatigue, renal injury and significant proteinuria. Patient has no other signs or symptoms of inflammatory disease at this time, but additional lab work will be obtained. Additionally, certain infection such as HIV and hepatitis C can be associated with similar picture and will be investigated. Additional evaluation included labs for the above with all returning back negative. Thing positive for multiple EBV antibodies including early antigen antibodies, but IgM was negative dictating previous infection instead of the acute infection at this time.   The most concerning finding at this time is diffuse hypermetabolic lymphadenopathy. This involves almost every basin of lymph nodes. May be consistent with diffuse infection such as histoplasmosis versus diffuse lymphoid malignancy such as lymphoma. Obtaining good quality biopsy of one of the lymph nodes as critical at this point in time. Surgical  biopsy wasn't possible due to technical difficulties.  Plan: --Recheck renal function and hematological profile today --Consult interventional radiology for CT or ultrasound-guided core biopsy of a lymph node of their choosing. --Return to my clinic 1 week following the biopsy to review the findings.  Voice recognition software was used and creation of this note. Despite my  best effort at editing the text, some misspelling/errors may have occurred.

## 2016-12-13 NOTE — Telephone Encounter (Signed)
Gave patient avs report and appointments for August. [facility] radiology will call re bx.

## 2016-12-15 ENCOUNTER — Other Ambulatory Visit: Payer: Self-pay | Admitting: Radiology

## 2016-12-18 ENCOUNTER — Ambulatory Visit (HOSPITAL_COMMUNITY)
Admission: RE | Admit: 2016-12-18 | Discharge: 2016-12-18 | Disposition: A | Discharge status: Home / Self Care | Payer: Medicare Other | Source: Ambulatory Visit | Attending: Hematology and Oncology | Admitting: Hematology and Oncology

## 2016-12-18 ENCOUNTER — Encounter (HOSPITAL_COMMUNITY): Payer: Self-pay

## 2016-12-18 DIAGNOSIS — R590 Localized enlarged lymph nodes: Secondary | ICD-10-CM | POA: Diagnosis not present

## 2016-12-18 DIAGNOSIS — R591 Generalized enlarged lymph nodes: Secondary | ICD-10-CM | POA: Diagnosis not present

## 2016-12-18 LAB — APTT: APTT: 30 s (ref 24–36)

## 2016-12-18 LAB — CBC
HCT: 36.5 % — ABNORMAL LOW (ref 39.0–52.0)
HEMOGLOBIN: 11.6 g/dL — AB (ref 13.0–17.0)
MCH: 24.8 pg — ABNORMAL LOW (ref 26.0–34.0)
MCHC: 31.8 g/dL (ref 30.0–36.0)
MCV: 78.2 fL (ref 78.0–100.0)
Platelets: 272 10*3/uL (ref 150–400)
RBC: 4.67 MIL/uL (ref 4.22–5.81)
RDW: 14.3 % (ref 11.5–15.5)
WBC: 8.8 10*3/uL (ref 4.0–10.5)

## 2016-12-18 LAB — PROTIME-INR
INR: 1.02
PROTHROMBIN TIME: 13.4 s (ref 11.4–15.2)

## 2016-12-18 MED ORDER — FENTANYL CITRATE (PF) 100 MCG/2ML IJ SOLN
INTRAMUSCULAR | Status: AC | PRN
  Administered 2016-12-18: 50 ug via INTRAVENOUS

## 2016-12-18 MED ORDER — SODIUM CHLORIDE 0.9 % IV SOLN: INTRAVENOUS | Status: DC

## 2016-12-18 MED ORDER — MIDAZOLAM HCL 2 MG/2ML IJ SOLN
INTRAMUSCULAR | Status: AC | PRN
  Administered 2016-12-18: 1 mg via INTRAVENOUS

## 2016-12-18 MED ORDER — MIDAZOLAM HCL 2 MG/2ML IJ SOLN
INTRAMUSCULAR | Status: AC
  Filled 2016-12-18: qty 2

## 2016-12-18 MED ORDER — LIDOCAINE HCL (PF) 1 % IJ SOLN
INTRAMUSCULAR | Status: AC
  Filled 2016-12-18: qty 30

## 2016-12-18 MED ORDER — FENTANYL CITRATE (PF) 100 MCG/2ML IJ SOLN
INTRAMUSCULAR | Status: AC
  Filled 2016-12-18: qty 2

## 2016-12-18 MED ORDER — HYDROCODONE-ACETAMINOPHEN 5-325 MG PO TABS: 1.0000 | ORAL_TABLET | ORAL | Status: DC | PRN

## 2016-12-18 NOTE — Sedation Documentation (Signed)
Patient is resting comfortably. 

## 2016-12-18 NOTE — H&P (Signed)
Chief Complaint: Patient was seen in consultation today for inguinal lymph node biopsy at the request of Perlov,Mikhail G  Referring Physician(s): Perlov,Mikhail G  Supervising Physician: Arne Cleveland  Patient Status: [facility] - Out-pt  History of Present Illness: Jacob Pope is a 76 y.o. male   Note 12/13/16: Hematology and Oncology    [] Hide copied text 76 year old male with presentation with gradually progressive anemia, renal insufficiency, and dysproteinemia. 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 The bone marrow biopsy was negative for evidence of a monoclonal plasma cell process. The plasma cells comprised about 10-12% of the BM cellularity, but they were polyclonal by appearance.   Our next differential here would be an autoimmune disorder based on the polyclonal nature of the gammopathy, profound fatigue, renal injury and significant proteinuria. Patient has no other signs or symptoms of inflammatory disease at this time, but additional lab work will be obtained. Additionally, certain infection such as HIV and hepatitis C can be associated with similar picture and will be investigated. Additional evaluation included labs for the above with all returning back negative. Thing positive for multiple EBV antibodies including early antigen antibodies, but IgM was negative dictating previous infection instead of the acute infection at this time.   The most concerning finding at this time is diffuse hypermetabolic lymphadenopathy. This involves almost every basin of lymph nodes. May be consistent with diffuse infection such as histoplasmosis versus diffuse lymphoid malignancy such as lymphoma. Obtaining good quality biopsy of one of the lymph nodes as critical at this point in time. Surgical  biopsy  wasn't possible due to technical difficulties.     10/2016 PET: IMPRESSION: 1. Hypermetabolic adenopathy observed in the axillary, subpectoral, right paratracheal, subcarinal, bilateral hilar, bilateral infrahilar, porta hepatis, peripancreatic, periaortic, common iliac, external iliac, and inguinal chains. There is also low-grade hypermetabolic activity in the left submandibular and left internal jugular chains. No solid organ involvement or definite bony involvement identified.   Scheduled today for inguinal node biopsy  Past Medical History:  Diagnosis Date  . Anginal pain ([place])    last was 1-2 months ago, on nifedepine and atenolol PRN to treat  . BPH (benign prostatic hyperplasia)    s/p TURP, Jan 2018  . CVA (cerebral infarction)   . Depression   . Headache   . Hypertension   . MI, old    x2  . Pneumonia    3 years ago  . Stroke [facility]) 05/2013   Precedent TIA while in [place]    Past Surgical History:  Procedure Laterality Date  . CARDIAC CATHETERIZATION  2009  . CATARACT EXTRACTION, BILATERAL Bilateral   . COLONOSCOPY  2016  . TRANSURETHRAL RESECTION OF PROSTATE N/A 06/06/2016   Procedure: TRANSURETHRAL RESECTION OF THE PROSTATE (TURP);  Surgeon: Irine Seal, MD;  Location: [facility] ORS;  Service: Urology;  Laterality: N/A;  . TRANSURETHRAL RESECTION OF PROSTATE  06/06/2016   Dr Jeffie Pollock    Allergies: Patient has no known allergies.  Medications: Prior to Admission medications   Medication Sig Start Date End Date Taking? Authorizing Provider  atenolol (TENORMIN) 25 MG tablet Take 25 mg by mouth daily as needed (chest pain).    Yes [provider]  NIFEdipine (PROCARDIA-XL/ADALAT-CC/NIFEDICAL-XL) 30 MG 24 hr tablet Take 15 mg by mouth 2 (two) times daily.    Yes [provider]     Family History  Problem Relation Age of Onset  . Cancer - Other Sister 66       Head and neck  . Cancer - Other Brother 68       At the neck  . Heart attack Sister 46    . Kidney disease Sister   . Hypertension Other     Social History   Social History  . Marital status: Married    Spouse name: N/A  . Number of children: N/A  . Years of education: N/A   Occupational History  . 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 restaurant    Social History Main Topics  . Smoking status: Former Smoker    Types: Cigarettes    Quit date: 06/02/2012  . Smokeless tobacco: Never Used  . Alcohol use Yes     Comment: beer on occasion   . Drug use: No  . Sexual activity: Not Asked   Other Topics Concern  . None   Social History Narrative  . None    Review of Systems: A 12 point ROS discussed and pertinent positives are indicated in the HPI above.  All other systems are negative.  Review of Systems  Constitutional: Positive for fatigue and unexpected weight change. Negative for fever.  Respiratory: Negative for cough and shortness of breath.   Gastrointestinal: Negative for abdominal pain.  Neurological: Positive for weakness.  Psychiatric/Behavioral: Negative for behavioral problems and confusion.    Vital Signs: BP 131/67   Pulse (!) 54   Temp 98 F (36.7 C)   Resp 20   Ht 5' 4"  (1.626 m)   Wt 135 lb (61.2 kg)   SpO2 99%   BMI 23.17 kg/m   Physical Exam  Constitutional: He is oriented to person, place, and time.  Cardiovascular: Normal rate, regular rhythm and normal heart sounds.   Pulmonary/Chest: Effort normal and breath sounds normal.  Abdominal: Soft. Bowel sounds are normal.  Musculoskeletal: Normal range of motion.  Neurological: He is alert and oriented to person, place, and time.  Skin: Skin is warm and dry.  Psychiatric: He has a normal mood and affect. His behavior is normal. Judgment and thought content normal.  Consented through interpreter at bedside Daughter also at bedside  Nursing note and vitals reviewed.   Mallampati Score:  MD Evaluation Airway: WNL Heart: WNL Abdomen: WNL Chest/ Lungs: WNL ASA  Classification:  3 Mallampati/Airway Score: One  Imaging: No results found.  Labs:  CBC:  Recent Labs  06/02/16 1435 10/20/16 1155 10/27/16 0917 12/13/16 1543  WBC 8.3 7.4 7.8 10.1  HGB 12.1* 12.1* 13.0 12.1*  HCT 37.0* 37.5* 39.6 37.5*  PLT 220 203 202 253    COAGS:  Recent Labs  11/03/16 1450  INR 1.0    BMP:  Recent Labs  06/02/16 1435 10/20/16 1155 12/13/16 1543  NA 136 137 137  K 4.3 4.0 3.9  CL 105  --   --   CO2 26 26 26   GLUCOSE 135* 89 76  BUN 22* 23.5 18.1  CALCIUM 8.3* 9.0 9.1  CREATININE 1.50* 1.9* 1.5*  GFRNONAA 43*  --   --   GFRAA 50*  --   --     LIVER FUNCTION TESTS:  Recent Labs  10/20/16 1155 10/20/16 1155 12/13/16 1543  BILITOT 0.30  --  0.30  AST 30  --  23  ALT 28  --  30  ALKPHOS 48  --  75  PROT 10.6* 9.6* 9.6*  ALBUMIN 3.3*  --  3.2*    TUMOR MARKERS: No results for input(s): AFPTM, CEA, CA199, CHROMGRNA in the last 8760 hours.  Assessment and Plan:  Diffuse lymphadenopathy; +PET Now scheduled for inguinal node biopsy Risks and benefits discussed with the patient including, but not limited to bleeding, infection, damage to adjacent structures or low yield requiring additional tests. All of the patient's questions were answered, patient is agreeable to proceed. Consent signed and in chart.  Thank you for this interesting consult.  I greatly enjoyed meeting TRAMANE GORUM and look forward to participating in their care.  A copy of this report was sent to the requesting provider on this date.  Electronically Signed: Lavonia Drafts, PA-C 12/18/2016, 12:29 PM   I spent a total of  30 Minutes   in face to face in clinical consultation, greater than 50% of which was counseling/coordinating care for inguinal node biopsy

## 2016-12-18 NOTE — Procedures (Signed)
  Procedure:   [place] core R ing LAN 18g cores to surg path in saline Preprocedure diagnosis:  adenopathy Postprocedure diagnosis:  same EBL:     minimal Complications:   none immediate  See full dictation in [facility].  Dillard Cannon MD Main # [phone] Pager  [phone]

## 2016-12-18 NOTE — Sedation Documentation (Signed)
Bandaid R groin intact

## 2016-12-18 NOTE — Discharge Instructions (Signed)
Needle Biopsy, Care After °These instructions give you information about caring for yourself after your procedure. Your doctor may also give you more specific instructions. Call your doctor if you have any problems or questions after your procedure. °Follow these instructions at home: °· Rest as told by your doctor. °· Take medicines only as told by your doctor. °· There are many different ways to close and cover the biopsy site, including stitches (sutures), skin glue, and adhesive strips. Follow instructions from your doctor about: °? How to take care of your biopsy site. °? When and how you should change your bandage (dressing). °? When you should remove your dressing. °? Removing whatever was used to close your biopsy site. °· Check your biopsy site every day for signs of infection. Watch for: °? Redness, swelling, or pain. °? Fluid, blood, or pus. °Contact a doctor if: °· You have a fever. °· You have redness, swelling, or pain at the biopsy site, and it lasts longer than a few days. °· You have fluid, blood, or pus coming from the biopsy site. °· You feel sick to your stomach (nauseous). °· You throw up (vomit). °Get help right away if: °· You are short of breath. °· You have trouble breathing. °· Your chest hurts. °· You feel dizzy or you pass out (faint). °· You have bleeding that does not stop with pressure or a bandage. °· You cough up blood. °· Your belly (abdomen) hurts. °This information is not intended to replace advice given to you by your health care provider. Make sure you discuss any questions you have with your health care provider. °Document Released: 04/13/2008 Document Revised: 10/07/2015 Document Reviewed: 04/27/2014 °Elsevier Interactive Patient Education © 2018 [employer] ° °

## 2016-12-27 ENCOUNTER — Telehealth: Payer: Self-pay | Admitting: Hematology and Oncology

## 2016-12-27 ENCOUNTER — Ambulatory Visit (HOSPITAL_BASED_OUTPATIENT_CLINIC_OR_DEPARTMENT_OTHER): Payer: Medicare Other | Admitting: Hematology and Oncology

## 2016-12-27 ENCOUNTER — Encounter: Payer: Self-pay | Admitting: Hematology and Oncology

## 2016-12-27 VITALS — BP 138/60 | HR 71 | Temp 97.9°F | Resp 18 | Ht 64.0 in | Wt 136.1 lb

## 2016-12-27 DIAGNOSIS — R768 Other specified abnormal immunological findings in serum: Secondary | ICD-10-CM

## 2016-12-27 DIAGNOSIS — R5383 Other fatigue: Secondary | ICD-10-CM | POA: Diagnosis not present

## 2016-12-27 DIAGNOSIS — R591 Generalized enlarged lymph nodes: Secondary | ICD-10-CM

## 2016-12-27 DIAGNOSIS — F329 Major depressive disorder, single episode, unspecified: Secondary | ICD-10-CM

## 2016-12-27 DIAGNOSIS — F32A Depression, unspecified: Secondary | ICD-10-CM

## 2016-12-27 NOTE — Telephone Encounter (Signed)
Gave patient avs report and appointments for September.  °

## 2016-12-28 DIAGNOSIS — F32A Depression, unspecified: Secondary | ICD-10-CM | POA: Insufficient documentation

## 2016-12-28 DIAGNOSIS — F329 Major depressive disorder, single episode, unspecified: Secondary | ICD-10-CM | POA: Insufficient documentation

## 2016-12-28 NOTE — Assessment & Plan Note (Signed)
76 year old male with presentation with gradually progressive anemia, renal insufficiency, and dysproteinemia. Polyclonal nature of the gammopathy was going against the possibility of a monoclonal process in the bone marrow, nevertheless due to constellation of the anemia, renal failure, and dysproteinemia, bone marrow biopsy was obtained. The bone marrow biopsy was negative for evidence of a monoclonal plasma cell process. The plasma cells comprised about 10-12% of the BM cellularity, but they were polyclonal by appearance. Additional evaluation reveals presence of hypermetabolic diffuse lymphadenopathy and biopsy of the lymph nodes demonstrated only a reactive process. No monoclonal B or T-cell populations were identified.  Our additional extensive evaluation and to possibility of an autoimmune condition or active infection revealed no conclusive evidence to suspect an autoimmune process. Patient tested negative for most of the infectious agents evaluated, but the pattern of EBV antibody suggests possible precedent infection within the reasonably short period of time prior to presentation. At this time, his clinical picture is most consistent with infectious mononucleosis with fatigue, lymphadenopathy, and hematological aberrations. The renal dysfunction does not fit well, but patient also exhibits signs and symptoms consistent with major depression including poor sleep habits, excessive somnolence, lack of social interests, poor appetite. Patient is quite resistant to the idea that he may be depressed.  At this time, I do not see any evidence of a malignant process that I can confirm. I have offered the patient 2 options: One be to continue observation with serial lab work obtained by myself and to expand the workup with significant changes are observed. The other option is to proceed with referral to rheumatology and infectious disease for more specialized evaluation for possible autoimmune or infectious  etiologies of his symptoms. Due to improving fatigue, patient prefers to be observed.   Plan: --RTC 1 month with labs -- based on patient's request as he plans on traveling to [place] in 2 months.   Voice recognition software was used and creation of this note. Despite my best effort at editing the text, some misspelling/errors may have occurred.

## 2016-12-28 NOTE — Progress Notes (Signed)
[facility] New Visit:  Assessment: Lymphadenopathy 76 year old male with presentation with gradually progressive anemia, renal insufficiency, and dysproteinemia. Polyclonal nature of the gammopathy was going against the possibility of a monoclonal process in the bone marrow, nevertheless due to constellation of the anemia, renal failure, and dysproteinemia, bone marrow biopsy was obtained. The bone marrow biopsy was negative for evidence of a monoclonal plasma cell process. The plasma cells comprised about 10-12% of the BM cellularity, but they were polyclonal by appearance. Additional evaluation reveals presence of hypermetabolic diffuse lymphadenopathy and biopsy of the lymph nodes demonstrated only a reactive process. No monoclonal B or T-cell populations were identified.  Our additional extensive evaluation and to possibility of an autoimmune condition or active infection revealed no conclusive evidence to suspect an autoimmune process. Patient tested negative for most of the infectious agents evaluated, but the pattern of EBV antibody suggests possible precedent infection within the reasonably short period of time prior to presentation. At this time, his clinical picture is most consistent with infectious mononucleosis with fatigue, lymphadenopathy, and hematological aberrations. The renal dysfunction does not fit well, but patient also exhibits signs and symptoms consistent with major depression including poor sleep habits, excessive somnolence, lack of social interests, poor appetite. Patient is quite resistant to the idea that he may be depressed.  At this time, I do not see any evidence of a malignant process that I can confirm. I have offered the patient 2 options: One be to continue observation with serial lab work obtained by myself and to expand the workup with significant changes are observed. The other option is to proceed with referral to rheumatology and infectious  disease for more specialized evaluation for possible autoimmune or infectious etiologies of his symptoms. Due to improving fatigue, patient prefers to be observed.   Plan: --RTC 1 month with labs -- based on patient's request as he plans on traveling to [place] in 2 months.   Voice recognition software was used and creation of this note. Despite my best effort at editing the text, some misspelling/errors may have occurred.   Orders Placed This Encounter  Procedures  . CBC with Differential    Standing Status:   Future    Standing Expiration Date:   12/27/2017  . Comprehensive metabolic panel    Standing Status:   Future    Standing Expiration Date:   12/27/2017  . Lactate dehydrogenase (LDH)    Standing Status:   Future    Standing Expiration Date:   12/27/2017    The encounter was conducted using assigned translator as patient does not speak [place]. All questions were answered.  . The patient knows to call the clinic with any problems, questions or concerns.  This note was electronically signed.    History of Presenting Illness Jacob Pope 76 y.o. followed in the [place] for diffuse lymphadenopathy. Please see Hematological history below for details. Patient referred due to presentation with progressive creatinine elevation which was noted in October 2017 with normal creatinines preceding that. In 2017 creatinine 1.7, April 2019 creatinine 2.9. With no evidence of proteinuria by urinalysis. Concurrent elevation of total protein and dropping albumin and development of mild anemia.   So far, we have conducted extensive evaluation including biochemical profiles, bone marrow biopsy, and most recently biopsy of one of the enlarged lymph nodes. Results of evaluation are listed below. Subjectively, patient reports decreasing fatigue, good appetite and no new symptoms. His daughter reports a different clinical picture with patient  exhibiting same level of fatigue, sleeping a lot  throughout the night and through the day with at least 2-3 episodes of naps. According to the daughter, patient has very little interest in activities of daily living, has very little interest in social activities either. There has been no significant night sweats, or weight loss.  Oncological/hematological History: --External Labs, 05/19/13: Cr 1.00; Hgb 17.0 --External Labs, 02/02/15: Cr 1.00;  --External Labs, 02/22/16: tProt 8.6, Alb 3.9, Cr 1.27; Hgb 13.1 --External Labs, 06/02/16: Cr 1.50; WBC 8.3, Hgb 12.1, Plt 220; --External Labs, 08/22/16: tProt 9.8, Alb 3.0, Cr 2.00; Hgb 12.7 --External Labs, 09/19/16: tProt 9.2, Alb 3.5, Ca 9.0, Cr 1.69, AP 45; SPEP -- no obvious M spike; uIFE -- no apparent monoclonal protein; kappa 299.2, lambda 78.0, KLR 3.84; WBC 7.2, Hgb 12.2, MCV 77, MCH 25.6, RDW 15.3, Plt 201; Fe 55, FeSat 24%, TIBC 225(low); C3 complement 61(low), C4 complement 7(low)  --Renal US BL, 09/28/16: Normal size and echogenicity of the kidneys without evidence of mass or hydronephrosis. --Labs, 10/20/16: tProt 10.6, Alb 3.3, Ca 9.0, Cr 1.9, AP 48; SPEP -- no apparent monoclonal protein; IgA 199, IgG 3921, IgM 37; kappa 334.4, lambda 81.7, KLR 4.09; LDH 200, sViscosity 2.6; WBC 7.4, Hgb 12.1, Plt 203;  --Labs, 11/03/16: ANA negative; No evidence of lupus anticoagulant presence, Anticardiolipin antibodies negative, DRVVT --  Negative; cryofibrinogen -- negative at 72 hours; negative for RPR, AV IgM, VCA IgM, otitis be service antigen, hepatitis B core IgM antibodies, HIV screen, HCV infection. --PET-CT, 41/63/84: Hypermetabolic lymphadenopathy in the axillary, subpectoral, right paratracheal, subcarinal, bilateral hilar, bilateral infrahilar, porta hepatis, peripancreatic,. Aortic, common iliac, external iliac, and inguinal locations. SUV max of 8.8 reported in the left external iliac lymph nodes. Other locations demonstrated similar or lower SUV activity. --LN Bx, 12/18/16: lymph nodal  tissue displaying preservation of the architecture with open sinuses. The lymphoid tissue mostly consists of small lymphocytes and relative abundance of plasma cells. Scattered small reactive appearing germinal centers are seen. Scattered small aggregates of histiocytes are seen but well formed granulomata are not identified. No metastatic malignancy is identified. Flow cytometric analysis was performed ([id number]) and failed to show any monoclonal B cell population or abnormal tTcell phenotype. In addition immunohistochemical stains including CD3, CD20, BCL-2, CD138, kappa and lambda were performed with appropriate controls. The stains show a mixture of T and B cells in their respective compartments. Germinal centers are BCL-2 negative. CD138 highlights the relatively abundant plasma cell component that appears to show a polyclonal staining pattern for kappa and lambda light chains although there is kappa light chain excess. Overall, the findings are nonspecific but consistent with reactive lymphoid hyperplasia with plasmacytosis. Clinical correlation is recommended.   CBC    Component Value Date/Time   WBC 8.8 12/18/2016 1153   RBC 4.67 12/18/2016 1153   HGB 11.6 (L) 12/18/2016 1153   HGB 12.1 (L) 12/13/2016 1543   HCT 36.5 (L) 12/18/2016 1153   HCT 37.5 (L) 12/13/2016 1543   PLT 272 12/18/2016 1153   PLT 253 12/13/2016 1543   MCV 78.2 12/18/2016 1153   MCV 80.3 12/13/2016 1543   MCH 24.8 (L) 12/18/2016 1153   MCHC 31.8 12/18/2016 1153   RDW 14.3 12/18/2016 1153   RDW 14.5 12/13/2016 1543   LYMPHSABS 4.1 (H) 12/13/2016 1543   MONOABS 0.8 12/13/2016 1543   EOSABS 0.1 12/13/2016 1543   BASOSABS 0.1 12/13/2016 1543    Medical History: Past Medical History:  Diagnosis Date  .  Anginal pain ([place])    last was 1-2 months ago, on nifedepine and atenolol PRN to treat  . BPH (benign prostatic hyperplasia)    s/p TURP, Jan 2018  . CVA (cerebral infarction)   . Depression   . Headache   .  Hypertension   . MI, old    x2  . Pneumonia    3 years ago  . Stroke [facility]) 05/2013   Precedent TIA while in [place]    Surgical History: Past Surgical History:  Procedure Laterality Date  . CARDIAC CATHETERIZATION  2009  . CATARACT EXTRACTION, BILATERAL Bilateral   . COLONOSCOPY  2016  . TRANSURETHRAL RESECTION OF PROSTATE N/A 06/06/2016   Procedure: TRANSURETHRAL RESECTION OF THE PROSTATE (TURP);  Surgeon: Irine Seal, MD;  Location: [facility];  Service: Urology;  Laterality: N/A;  . TRANSURETHRAL RESECTION OF PROSTATE  06/06/2016   Dr Jeffie Pollock    Family History: Family History  Problem Relation Age of Onset  . Cancer - Other Sister 41       Head and neck  . Cancer - Other Brother 68       At the neck  . Heart attack Sister 57  . Kidney disease Sister   . Hypertension Other     Social History: Social History   Social History  . Marital status: Married    Spouse name: N/A  . Number of children: N/A  . Years of education: N/A   Occupational History  . 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 restaurant    Social History Main Topics  . Smoking status: Former Smoker    Types: Cigarettes    Quit date: 06/02/2012  . Smokeless tobacco: Never Used  . Alcohol use Yes     Comment: beer on occasion   . Drug use: No  . Sexual activity: Not on file   Other Topics Concern  . Not on file   Social History Narrative  . No narrative on file    Allergies: No Known Allergies  Medications:  Current Outpatient Prescriptions  Medication Sig Dispense Refill  . atenolol (TENORMIN) 25 MG tablet Take 25 mg by mouth daily as needed (chest pain).     Marland Kitchen NIFEdipine (PROCARDIA-XL/ADALAT-CC/NIFEDICAL-XL) 30 MG 24 hr tablet Take 15 mg by mouth 2 (two) times daily.      No current facility-administered medications for this visit.     Review of Systems: Review of Systems  Constitutional: Positive for appetite change and fatigue. Negative for chills, diaphoresis and fever.  HENT:   Negative for  lump/mass, mouth sores, nosebleeds, sore throat, tinnitus, trouble swallowing and voice change.   Eyes: Negative for eye problems and icterus.  Respiratory: Negative for cough, hemoptysis, shortness of breath and wheezing.   Cardiovascular: Positive for leg swelling. Negative for chest pain and palpitations.  Gastrointestinal: Negative for abdominal distention, abdominal pain, blood in stool, constipation, diarrhea, nausea and vomiting.  Genitourinary: Negative for difficulty urinating and hematuria.   Musculoskeletal: Negative for arthralgias, back pain, flank pain, myalgias, neck pain and neck stiffness.  Skin: Negative for itching and rash.  Neurological: Negative for dizziness, extremity weakness and headaches.  Hematological: Negative for adenopathy.  All other systems reviewed and are negative.    PHYSICAL EXAMINATION Blood pressure 138/60, pulse 71, temperature 97.9 F (36.6 C), temperature source Oral, resp. rate 18, height 5' 4"  (1.626 m), weight 136 lb 1.6 oz (61.7 kg), SpO2 99 %.  ECOG PERFORMANCE STATUS: 1 - Symptomatic but completely ambulatory  Physical Exam  Constitutional: He is oriented to person, place, and time and well-developed, well-nourished, and in no distress. Vital signs are normal. No distress.  HENT:  Head: Normocephalic and atraumatic.  Mouth/Throat: Oropharynx is clear and moist. No oropharyngeal exudate.  Eyes: Pupils are equal, round, and reactive to light. EOM are normal. Right eye exhibits no discharge. Left eye exhibits no discharge. No scleral icterus.  Neck: No JVD present. No tracheal deviation present. No thyromegaly present.  Cardiovascular: Normal rate, regular rhythm and normal heart sounds.   Pulmonary/Chest: Breath sounds normal. No respiratory distress. He has no wheezes.  Abdominal: Soft. He exhibits no distension. There is no tenderness. There is no guarding.  Musculoskeletal: He exhibits edema (1+ bilateral pitting).  Lymphadenopathy:        Head (right side): No submandibular and no occipital adenopathy present.       Head (left side): No submandibular and no occipital adenopathy present.    He has no cervical adenopathy.    He has no axillary adenopathy.       Right: No inguinal and no supraclavicular adenopathy present.       Left: No inguinal and no supraclavicular adenopathy present.  Neurological: He is alert and oriented to person, place, and time. He has normal reflexes. No cranial nerve deficit.  Skin: Skin is warm. No rash noted. He is not diaphoretic.     LABORATORY DATA: I have personally reviewed the data as listed: No visits with results within 1 Week(s) from this visit.  Latest known visit with results is:  Hospital Outpatient Visit on 12/18/2016  Component Date Value Ref Range Status  . aPTT 12/18/2016 30  24 - 36 seconds Final  . WBC 12/18/2016 8.8  4.0 - 10.5 K/uL Final  . RBC 12/18/2016 4.67  4.22 - 5.81 MIL/uL Final  . Hemoglobin 12/18/2016 11.6* 13.0 - 17.0 g/dL Final  . HCT 12/18/2016 36.5* 39.0 - 52.0 % Final  . MCV 12/18/2016 78.2  78.0 - 100.0 fL Final  . MCH 12/18/2016 24.8* 26.0 - 34.0 pg Final  . MCHC 12/18/2016 31.8  30.0 - 36.0 g/dL Final  . RDW 12/18/2016 14.3  11.5 - 15.5 % Final  . Platelets 12/18/2016 272  150 - 400 K/uL Final  . Prothrombin Time 12/18/2016 13.4  11.4 - 15.2 seconds Final  . INR 12/18/2016 1.02   Final       Ardath Sax, MD

## 2017-01-26 ENCOUNTER — Telehealth: Payer: Self-pay

## 2017-01-26 ENCOUNTER — Other Ambulatory Visit: Payer: Medicare Other

## 2017-01-26 ENCOUNTER — Ambulatory Visit: Payer: Medicare Other | Admitting: Hematology and Oncology

## 2017-01-26 NOTE — Telephone Encounter (Signed)
Contacted pt at home and spoke with wife. Okay to change appointment to next Tuesday. Rescheduled lab and doctor appt. Appointments cancelled for today.

## 2017-01-29 ENCOUNTER — Other Ambulatory Visit: Payer: Medicare Other

## 2017-01-29 ENCOUNTER — Ambulatory Visit: Payer: Medicare Other | Admitting: Hematology and Oncology

## 2017-01-30 ENCOUNTER — Ambulatory Visit (HOSPITAL_BASED_OUTPATIENT_CLINIC_OR_DEPARTMENT_OTHER): Payer: Medicare Other | Admitting: Hematology and Oncology

## 2017-01-30 ENCOUNTER — Telehealth: Payer: Self-pay | Admitting: Hematology and Oncology

## 2017-01-30 ENCOUNTER — Other Ambulatory Visit (HOSPITAL_BASED_OUTPATIENT_CLINIC_OR_DEPARTMENT_OTHER): Payer: Medicare Other

## 2017-01-30 ENCOUNTER — Encounter: Payer: Self-pay | Admitting: Hematology and Oncology

## 2017-01-30 VITALS — BP 150/70 | HR 67 | Temp 98.0°F | Resp 18 | Wt 135.8 lb

## 2017-01-30 DIAGNOSIS — R768 Other specified abnormal immunological findings in serum: Secondary | ICD-10-CM | POA: Diagnosis not present

## 2017-01-30 DIAGNOSIS — R769 Abnormal immunological finding in serum, unspecified: Secondary | ICD-10-CM | POA: Diagnosis present

## 2017-01-30 DIAGNOSIS — R591 Generalized enlarged lymph nodes: Secondary | ICD-10-CM | POA: Diagnosis not present

## 2017-01-30 LAB — CBC WITH DIFFERENTIAL/PLATELET
BASO%: 0.5 % (ref 0.0–2.0)
Basophils Absolute: 0 10*3/uL (ref 0.0–0.1)
EOS ABS: 0.1 10*3/uL (ref 0.0–0.5)
EOS%: 1.3 % (ref 0.0–7.0)
HCT: 37 % — ABNORMAL LOW (ref 38.4–49.9)
HGB: 11.9 g/dL — ABNORMAL LOW (ref 13.0–17.1)
LYMPH%: 40.5 % (ref 14.0–49.0)
MCH: 25.3 pg — ABNORMAL LOW (ref 27.2–33.4)
MCHC: 32.2 g/dL (ref 32.0–36.0)
MCV: 78.7 fL — AB (ref 79.3–98.0)
MONO#: 0.7 10*3/uL (ref 0.1–0.9)
MONO%: 8.9 % (ref 0.0–14.0)
NEUT%: 48.8 % (ref 39.0–75.0)
NEUTROS ABS: 3.7 10*3/uL (ref 1.5–6.5)
PLATELETS: 211 10*3/uL (ref 140–400)
RBC: 4.7 10*6/uL (ref 4.20–5.82)
RDW: 14.7 % — AB (ref 11.0–14.6)
WBC: 7.6 10*3/uL (ref 4.0–10.3)
lymph#: 3.1 10*3/uL (ref 0.9–3.3)

## 2017-01-30 LAB — LACTATE DEHYDROGENASE: LDH: 181 U/L (ref 125–245)

## 2017-01-30 LAB — COMPREHENSIVE METABOLIC PANEL
ALBUMIN: 3.4 g/dL — AB (ref 3.5–5.0)
ALT: 22 U/L (ref 0–55)
AST: 21 U/L (ref 5–34)
Alkaline Phosphatase: 63 U/L (ref 40–150)
Anion Gap: 7 mEq/L (ref 3–11)
BILIRUBIN TOTAL: 0.35 mg/dL (ref 0.20–1.20)
BUN: 18.5 mg/dL (ref 7.0–26.0)
CHLORIDE: 101 meq/L (ref 98–109)
CO2: 27 mEq/L (ref 22–29)
CREATININE: 1.5 mg/dL — AB (ref 0.7–1.3)
Calcium: 8.8 mg/dL (ref 8.4–10.4)
EGFR: 44 mL/min/{1.73_m2} — ABNORMAL LOW (ref >90)
GLUCOSE: 76 mg/dL (ref 70–140)
Potassium: 4.1 mEq/L (ref 3.5–5.1)
SODIUM: 136 meq/L (ref 136–145)
Total Protein: 9.2 g/dL — ABNORMAL HIGH (ref 6.4–8.3)

## 2017-01-30 NOTE — Telephone Encounter (Signed)
Scheduled apt per 9/18 los - Gave patient AVS and calender  Per los.

## 2017-02-04 NOTE — Assessment & Plan Note (Signed)
76 y.o. male with presentation with gradually progressive anemia, renal insufficiency, and dysproteinemia. Polyclonal nature of the gammopathy was going against the possibility of a monoclonal process in the bone marrow, nevertheless due to constellation of the anemia, renal failure, and dysproteinemia, bone marrow biopsy was obtained. The bone marrow biopsy was negative for evidence of a monoclonal plasma cell process. The plasma cells comprised about 10-12% of the BM cellularity, but they were polyclonal by appearance. Additional evaluation reveals presence of hypermetabolic diffuse lymphadenopathy and biopsy of the lymph nodes demonstrated only a reactive process. No monoclonal B or T-cell populations were identified.  Our additional extensive evaluation and to possibility of an autoimmune condition or active infection revealed no conclusive evidence to suspect an autoimmune process. Patient tested negative for most of the infectious agents evaluated, but the pattern of EBV antibody suggests possible precedent infection within the reasonably short period of time prior to presentation. At this time, his clinical picture is most consistent with infectious mononucleosis with fatigue, lymphadenopathy, and hematological aberrations. The renal dysfunction does not fit well, but patient also exhibits signs and symptoms consistent with major depression including poor sleep habits, excessive somnolence, lack of social interests, poor appetite. Patient is quite resistant to the idea that he may be depressed.  At this time, I do not see any evidence of a malignant process that I can confirm. Patient was offered additional evaluation by rheumatology and infectious disease, but has declined. In the interim, patient reports near complete resolution of the previously presenting fatigue. His appetite and activities of daily living have improved as well. I believe that patient is recovering from previous episode which was  likely precipitated by infectious mononucleosis.  Plan: --Return to clinic in 1 year for follow-up with labs to assess for stable resolution of the symptoms   Voice recognition software was used and creation of this note. Despite my best effort at editing the text, some misspelling/errors may have occurred.

## 2017-02-04 NOTE — Progress Notes (Signed)
Jacob Pope New Visit:  Assessment: Elevated serum immunoglobulin free light chain level 76 y.o. male with presentation with gradually progressive anemia, renal insufficiency, and dysproteinemia. Polyclonal nature of the gammopathy was going against the possibility of a monoclonal process in the bone marrow, nevertheless due to constellation of the anemia, renal failure, and dysproteinemia, bone marrow biopsy was obtained. The bone marrow biopsy was negative for evidence of a monoclonal plasma cell process. The plasma cells comprised about 10-12% of the BM cellularity, but they were polyclonal by appearance. Additional evaluation reveals presence of hypermetabolic diffuse lymphadenopathy and biopsy of the lymph nodes demonstrated only a reactive process. No monoclonal B or T-cell populations were identified.  Our additional extensive evaluation and to possibility of an autoimmune condition or active infection revealed no conclusive evidence to suspect an autoimmune process. Patient tested negative for most of the infectious agents evaluated, but the pattern of EBV antibody suggests possible precedent infection within the reasonably short period of time prior to presentation. At this time, his clinical picture is most consistent with infectious mononucleosis with fatigue, lymphadenopathy, and hematological aberrations. The renal dysfunction does not fit well, but patient also exhibits signs and symptoms consistent with major depression including poor sleep habits, excessive somnolence, lack of social interests, poor appetite. Patient is quite resistant to the idea that he may be depressed.  At this time, I do not see any evidence of a malignant process that I can confirm. Patient was offered additional evaluation by rheumatology and infectious disease, but has declined. In the interim, patient reports near complete resolution of the previously presenting fatigue. His appetite and  activities of daily living have improved as well. I believe that patient is recovering from previous episode which was likely precipitated by infectious mononucleosis.  Plan: --Return to clinic in 1 year for follow-up with labs to assess for stable resolution of the symptoms   Voice recognition software was used and creation of this note. Despite my best effort at editing the text, some misspelling/errors may have occurred.   Orders Placed This Encounter  Procedures  . CBC with Differential    Standing Status:   Future    Standing Expiration Date:   01/30/2018  . Comprehensive metabolic panel    Standing Status:   Future    Standing Expiration Date:   01/30/2018  . Lactate dehydrogenase (LDH)    Standing Status:   Future    Standing Expiration Date:   01/30/2018  . Kappa/lambda light chains    Standing Status:   Future    Standing Expiration Date:   01/30/2018    The encounter was conducted using assigned translator as patient does not speak [place]. All questions were answered.  . The patient knows to call the clinic with any problems, questions or concerns.  This note was electronically signed.    History of Presenting Illness Jacob Pope 76 y.o. followed in the [facility] for diffuse lymphadenopathy. Please see Hematological history below for details. Patient referred due to presentation with progressive creatinine elevation which was noted in October 2017 with normal creatinines preceding that. In 2017 creatinine 1.7, April 2019 creatinine 2.9. With no evidence of proteinuria by urinalysis. Concurrent elevation of total protein and dropping albumin and development of mild anemia.   Patient underwent extensive evaluation which revealed no definitive etiology for the presenting symptoms and abnormalities. Patient was last seen approximately month ago. Since the last visit to the clinic, patient reports significant improvement in the  energy which is confirmed by his daughter. He is  interested in activities of daily living has also improved as well as his appetite. This time, he is close to returning to the baseline state of health.   Oncological/hematological History: --External Labs, 05/19/13: Cr 1.00; Hgb 17.0 --External Labs, 02/02/15: Cr 1.00;  --External Labs, 02/22/16: tProt 8.6, Alb 3.9, Cr 1.27; Hgb 13.1 --External Labs, 06/02/16: Cr 1.50; WBC 8.3, Hgb 12.1, Plt 220; --External Labs, 08/22/16: tProt 9.8, Alb 3.0, Cr 2.00; Hgb 12.7 --External Labs, 09/19/16: tProt 9.2, Alb 3.5, Ca 9.0, Cr 1.69, AP 45; SPEP -- no obvious M spike; uIFE -- no apparent monoclonal protein; kappa 299.2, lambda 78.0, KLR 3.84; WBC 7.2, Hgb 12.2, MCV 77, MCH 25.6, RDW 15.3, Plt 201; Fe 55, FeSat 24%, TIBC 225(low); C3 complement 61(low), C4 complement 7(low)  --Renal US BL, 09/28/16: Normal size and echogenicity of the kidneys without evidence of mass or hydronephrosis. --Labs, 10/20/16: tProt 10.6, Alb 3.3, Ca 9.0, Cr 1.9, AP 48; SPEP -- no apparent monoclonal protein; IgA 199, IgG 3921, IgM 37; kappa 334.4, lambda 81.7, KLR 4.09; LDH 200, sViscosity 2.6; WBC 7.4, Hgb 12.1, Plt 203;  --Labs, 11/03/16: ANA negative; No evidence of lupus anticoagulant presence, Anticardiolipin antibodies negative, DRVVT --  Negative; cryofibrinogen -- negative at 72 hours; negative for RPR, AV IgM, VCA IgM, otitis be service antigen, hepatitis B core IgM antibodies, HIV screen, HCV infection. --PET-CT, 70/96/28: Hypermetabolic lymphadenopathy in the axillary, subpectoral, right paratracheal, subcarinal, bilateral hilar, bilateral infrahilar, porta hepatis, peripancreatic,. Aortic, common iliac, external iliac, and inguinal locations. SUV max of 8.8 reported in the left external iliac lymph nodes. Other locations demonstrated similar or lower SUV activity. --LN Bx, 12/18/16: lymph nodal tissue displaying preservation of the architecture with open sinuses. The lymphoid tissue mostly consists of small lymphocytes and  relative abundance of plasma cells. Scattered small reactive appearing germinal centers are seen. Scattered small aggregates of histiocytes are seen but well formed granulomata are not identified. No metastatic malignancy is identified. Flow cytometric analysis was performed ([id number]) and failed to show any monoclonal B cell population or abnormal tTcell phenotype. In addition immunohistochemical stains including CD3, CD20, BCL-2, CD138, kappa and lambda were performed with appropriate controls. The stains show a mixture of T and B cells in their respective compartments. Germinal centers are BCL-2 negative. CD138 highlights the relatively abundant plasma cell component that appears to show a polyclonal staining pattern for kappa and lambda light chains although there is kappa light chain excess. Overall, the findings are nonspecific but consistent with reactive lymphoid hyperplasia with plasmacytosis. Clinical correlation is recommended.   CBC    Component Value Date/Time   WBC 7.6 01/30/2017 1429   WBC 8.8 12/18/2016 1153   RBC 4.70 01/30/2017 1429   RBC 4.67 12/18/2016 1153   HGB 11.9 (L) 01/30/2017 1429   HCT 37.0 (L) 01/30/2017 1429   PLT 211 01/30/2017 1429   MCV 78.7 (L) 01/30/2017 1429   MCH 25.3 (L) 01/30/2017 1429   MCH 24.8 (L) 12/18/2016 1153   MCHC 32.2 01/30/2017 1429   MCHC 31.8 12/18/2016 1153   RDW 14.7 (H) 01/30/2017 1429   LYMPHSABS 3.1 01/30/2017 1429   MONOABS 0.7 01/30/2017 1429   EOSABS 0.1 01/30/2017 1429   BASOSABS 0.0 01/30/2017 1429    Medical History: Past Medical History:  Diagnosis Date  . Anginal pain ([place])    last was 1-2 months ago, on nifedepine and atenolol PRN to treat  . BPH (benign prostatic  hyperplasia)    s/p TURP, Jan 2018  . CVA (cerebral infarction)   . Depression   . Headache   . Hypertension   . MI, old    x2  . Pneumonia    3 years ago  . Stroke [facility]) 05/2013   Precedent TIA while in [place]    Surgical History: Past Surgical  History:  Procedure Laterality Date  . CARDIAC CATHETERIZATION  2009  . CATARACT EXTRACTION, BILATERAL Bilateral   . COLONOSCOPY  2016  . TRANSURETHRAL RESECTION OF PROSTATE N/A 06/06/2016   Procedure: TRANSURETHRAL RESECTION OF THE PROSTATE (TURP);  Surgeon: Irine Seal, MD;  Location: [facility];  Service: Urology;  Laterality: N/A;  . TRANSURETHRAL RESECTION OF PROSTATE  06/06/2016   Dr Jeffie Pollock    Family History: Family History  Problem Relation Age of Onset  . Pope - Other Sister 7       Head and neck  . Pope - Other Brother 68       At the neck  . Heart attack Sister 57  . Kidney disease Sister   . Hypertension Other     Social History: Social History   Social History  . Marital status: Married    Spouse name: N/A  . Number of children: N/A  . Years of education: N/A   Occupational History  . 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 restaurant    Social History Main Topics  . Smoking status: Former Smoker    Types: Cigarettes    Quit date: 06/02/2012  . Smokeless tobacco: Never Used  . Alcohol use Yes     Comment: beer on occasion   . Drug use: No  . Sexual activity: Not on file   Other Topics Concern  . Not on file   Social History Narrative  . No narrative on file    Allergies: No Known Allergies  Medications:  Current Outpatient Prescriptions  Medication Sig Dispense Refill  . atenolol (TENORMIN) 25 MG tablet Take 25 mg by mouth daily as needed (chest pain).     Marland Kitchen NIFEdipine (PROCARDIA-XL/ADALAT-CC/NIFEDICAL-XL) 30 MG 24 hr tablet Take 15 mg by mouth 2 (two) times daily.      No current facility-administered medications for this visit.     Review of Systems: Review of Systems  Constitutional: Negative for appetite change, chills, diaphoresis, fatigue and fever.  HENT:   Negative for lump/mass, mouth sores, nosebleeds, sore throat, tinnitus, trouble swallowing and voice change.   Eyes: Negative for eye problems and icterus.  Respiratory: Negative for  cough, hemoptysis, shortness of breath and wheezing.   Cardiovascular: Negative for chest pain, leg swelling and palpitations.  Gastrointestinal: Negative for abdominal distention, abdominal pain, blood in stool, constipation, diarrhea, nausea and vomiting.  Genitourinary: Negative for difficulty urinating and hematuria.   Musculoskeletal: Negative for arthralgias, back pain, flank pain, myalgias, neck pain and neck stiffness.  Skin: Negative for itching and rash.  Neurological: Negative for dizziness, extremity weakness and headaches.  Hematological: Negative for adenopathy.  All other systems reviewed and are negative.    PHYSICAL EXAMINATION Blood pressure (!) 150/70, pulse 67, temperature 98 F (36.7 C), temperature source Oral, resp. rate 18, weight 135 lb 12.8 oz (61.6 kg), SpO2 100 %.  ECOG PERFORMANCE STATUS: 1 - Symptomatic but completely ambulatory  Physical Exam  Constitutional: He is oriented to person, place, and time and well-developed, well-nourished, and in no distress. Vital signs are normal. No distress.  HENT:  Head: Normocephalic and atraumatic.  Mouth/Throat: Oropharynx is clear and moist. No oropharyngeal exudate.  Eyes: Pupils are equal, round, and reactive to light. EOM are normal. Right eye exhibits no discharge. Left eye exhibits no discharge. No scleral icterus.  Neck: No JVD present. No tracheal deviation present. No thyromegaly present.  Cardiovascular: Normal rate, regular rhythm and normal heart sounds.   Pulmonary/Chest: Breath sounds normal. No respiratory distress. He has no wheezes.  Abdominal: Soft. He exhibits no distension. There is no tenderness. There is no guarding.  Musculoskeletal: He exhibits edema (1+ bilateral pitting).  Lymphadenopathy:       Head (right side): No submandibular and no occipital adenopathy present.       Head (left side): No submandibular and no occipital adenopathy present.    He has no cervical adenopathy.    He has no  axillary adenopathy.       Right: No inguinal and no supraclavicular adenopathy present.       Left: No inguinal and no supraclavicular adenopathy present.  Neurological: He is alert and oriented to person, place, and time. He has normal reflexes. No cranial nerve deficit.  Skin: Skin is warm. No rash noted. He is not diaphoretic.     LABORATORY DATA: I have personally reviewed the data as listed: Appointment on 01/30/2017  Component Date Value Ref Range Status  . WBC 01/30/2017 7.6  4.0 - 10.3 10e3/uL Final  . NEUT# 01/30/2017 3.7  1.5 - 6.5 10e3/uL Final  . HGB 01/30/2017 11.9* 13.0 - 17.1 g/dL Final  . HCT 01/30/2017 37.0* 38.4 - 49.9 % Final  . Platelets 01/30/2017 211  140 - 400 10e3/uL Final  . MCV 01/30/2017 78.7* 79.3 - 98.0 fL Final  . MCH 01/30/2017 25.3* 27.2 - 33.4 pg Final  . MCHC 01/30/2017 32.2  32.0 - 36.0 g/dL Final  . RBC 01/30/2017 4.70  4.20 - 5.82 10e6/uL Final  . RDW 01/30/2017 14.7* 11.0 - 14.6 % Final  . lymph# 01/30/2017 3.1  0.9 - 3.3 10e3/uL Final  . MONO# 01/30/2017 0.7  0.1 - 0.9 10e3/uL Final  . Eosinophils Absolute 01/30/2017 0.1  0.0 - 0.5 10e3/uL Final  . Basophils Absolute 01/30/2017 0.0  0.0 - 0.1 10e3/uL Final  . NEUT% 01/30/2017 48.8  39.0 - 75.0 % Final  . LYMPH% 01/30/2017 40.5  14.0 - 49.0 % Final  . MONO% 01/30/2017 8.9  0.0 - 14.0 % Final  . EOS% 01/30/2017 1.3  0.0 - 7.0 % Final  . BASO% 01/30/2017 0.5  0.0 - 2.0 % Final  . Sodium 01/30/2017 136  136 - 145 mEq/L Final  . Potassium 01/30/2017 4.1  3.5 - 5.1 mEq/L Final  . Chloride 01/30/2017 101  98 - 109 mEq/L Final  . CO2 01/30/2017 27  22 - 29 mEq/L Final  . Glucose 01/30/2017 76  70 - 140 mg/dl Final   Glucose reference range is for nonfasting patients. Fasting glucose reference range is 70- 100.  Marland Kitchen BUN 01/30/2017 18.5  7.0 - 26.0 mg/dL Final  . Creatinine 01/30/2017 1.5* 0.7 - 1.3 mg/dL Final  . Total Bilirubin 01/30/2017 0.35  0.20 - 1.20 mg/dL Final  . Alkaline Phosphatase  01/30/2017 63  40 - 150 U/L Final  . AST 01/30/2017 21  5 - 34 U/L Final  . ALT 01/30/2017 22  0 - 55 U/L Final  . Total Protein 01/30/2017 9.2* 6.4 - 8.3 g/dL Final  . Albumin 01/30/2017 3.4* 3.5 - 5.0 g/dL Final  . Calcium 01/30/2017 8.8  8.4 - 10.4 mg/dL Final  . Anion Gap 01/30/2017 7  3 - 11 mEq/L Final  . EGFR 01/30/2017 44* >90 ml/min/1.73 m2 Final   eGFR is calculated using the CKD-EPI Creatinine Equation (2009)  . LDH 01/30/2017 181  125 - 245 U/L Final       Ardath Sax, MD

## 2017-02-05 DIAGNOSIS — H04123 Dry eye syndrome of bilateral lacrimal glands: Secondary | ICD-10-CM | POA: Diagnosis not present

## 2017-02-05 DIAGNOSIS — H43393 Other vitreous opacities, bilateral: Secondary | ICD-10-CM | POA: Diagnosis not present

## 2017-02-06 DIAGNOSIS — E8809 Other disorders of plasma-protein metabolism, not elsewhere classified: Secondary | ICD-10-CM | POA: Diagnosis not present

## 2017-02-06 DIAGNOSIS — N183 Chronic kidney disease, stage 3 (moderate): Secondary | ICD-10-CM | POA: Diagnosis not present

## 2017-02-06 DIAGNOSIS — I129 Hypertensive chronic kidney disease with stage 1 through stage 4 chronic kidney disease, or unspecified chronic kidney disease: Secondary | ICD-10-CM | POA: Diagnosis not present

## 2017-02-06 DIAGNOSIS — D649 Anemia, unspecified: Secondary | ICD-10-CM | POA: Diagnosis not present

## 2017-07-28 IMAGING — US US RENAL
1 series · 14 of 25 positions shown · non-contrast
Comparison: No recent prior.

CLINICAL DATA: Chronic renal disease .

EXAM:
RENAL / URINARY TRACT ULTRASOUND COMPLETE

[Series 1: us renal · 0.20mm/px · 14 of 44 slices shown]
[im 1/44]
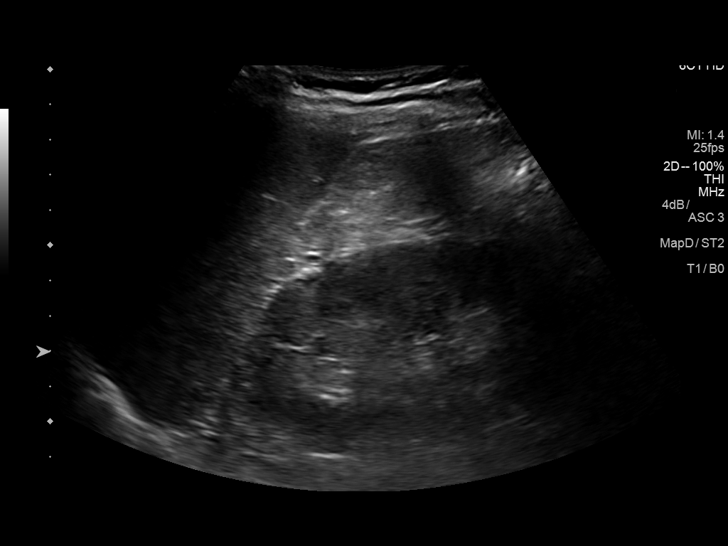
[im 4/44]
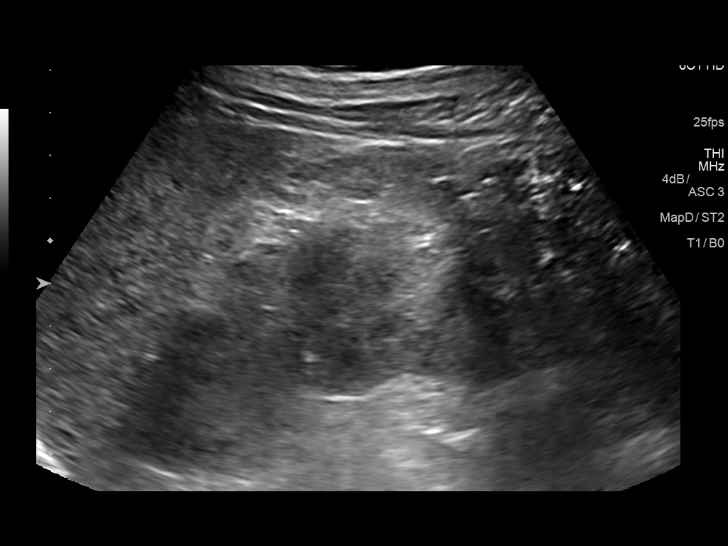
[im 8/44]
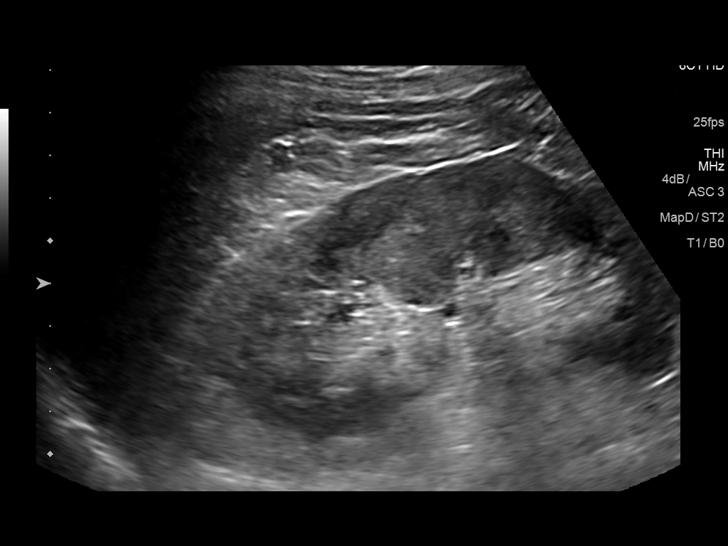
[im 11/44]
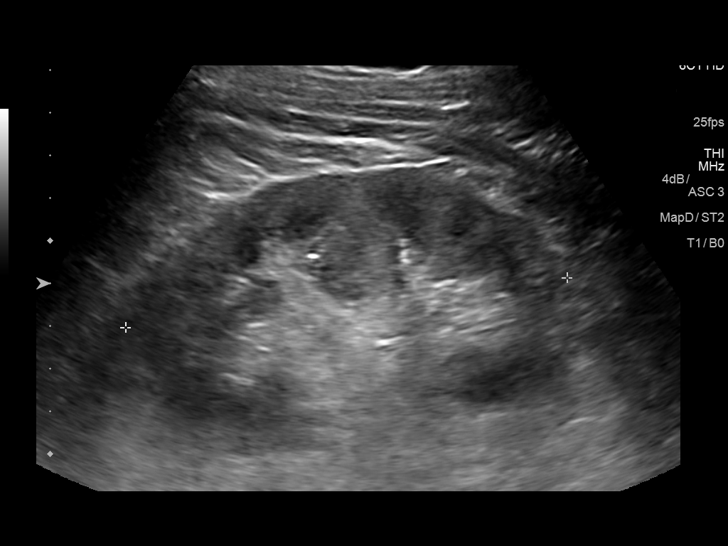
[im 15/44]
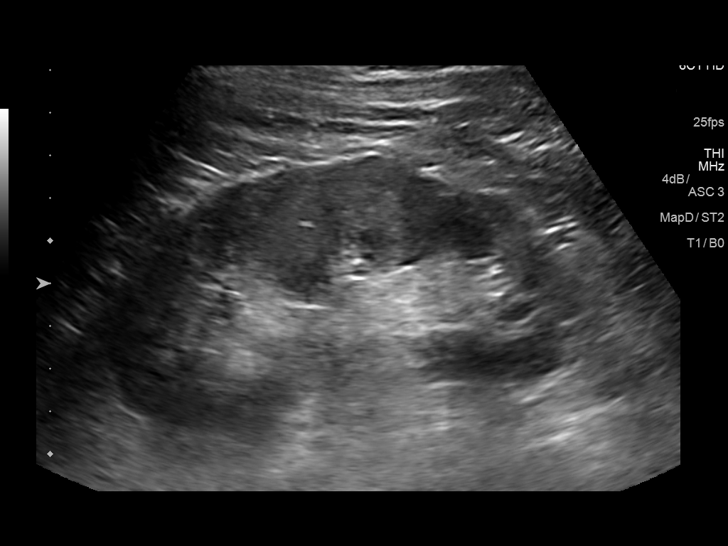
[im 17/44]
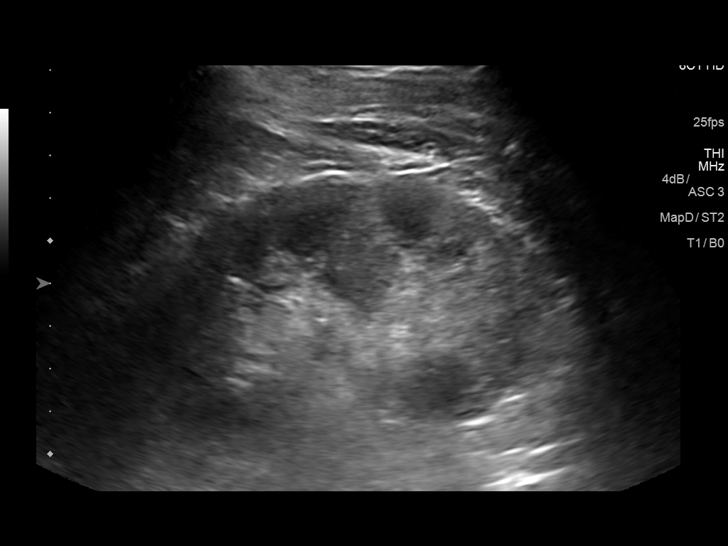
[im 20/44]
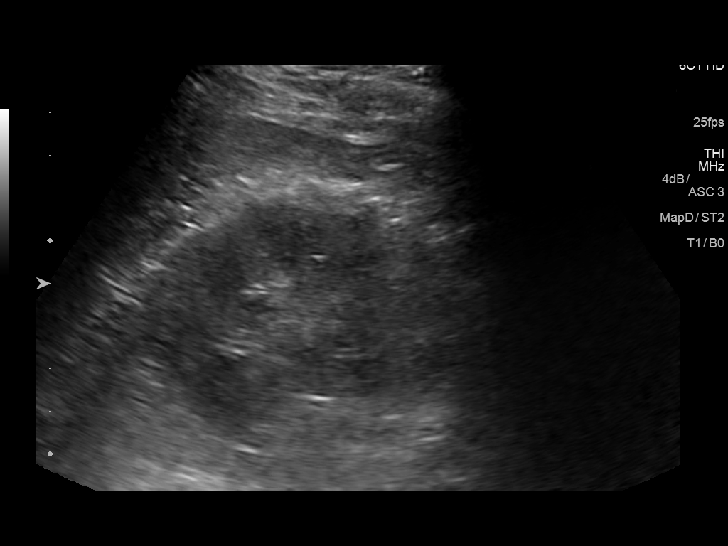
[im 24/44]
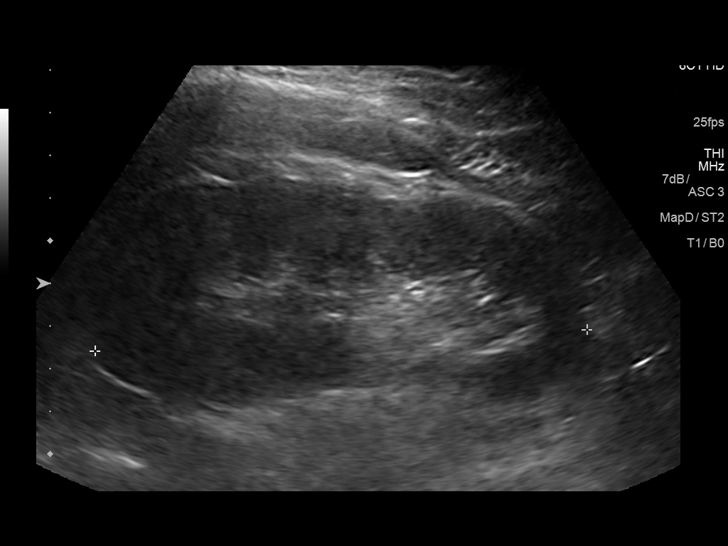
[im 27/44]
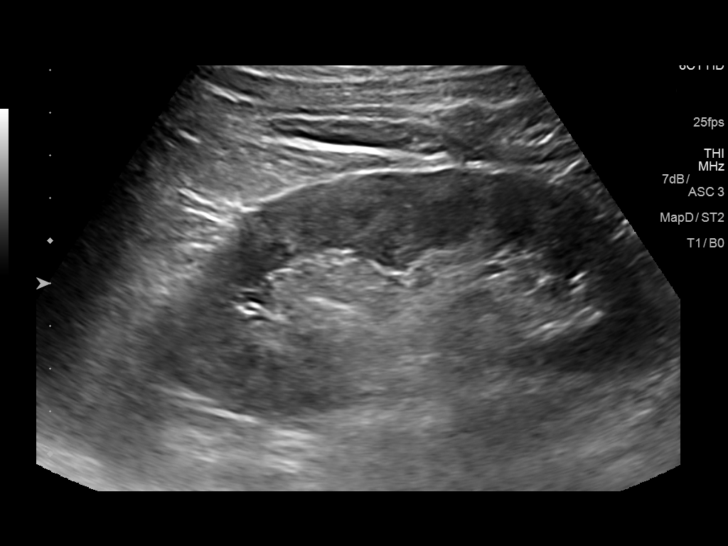
[im 29/44]
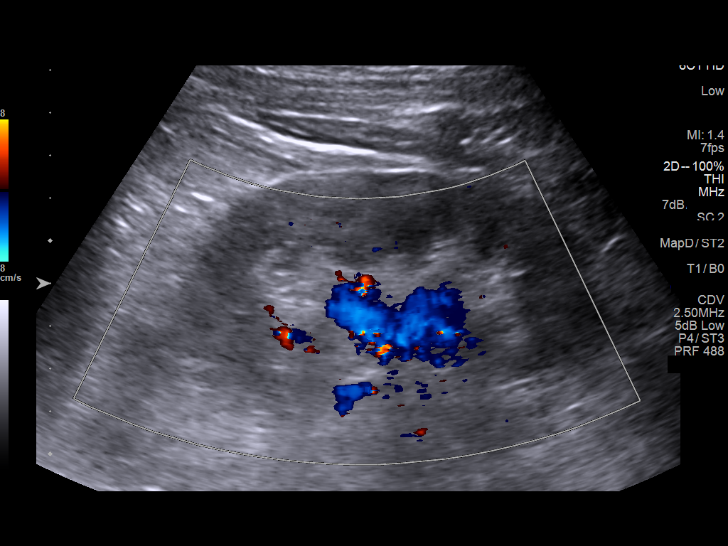
[im 33/44]
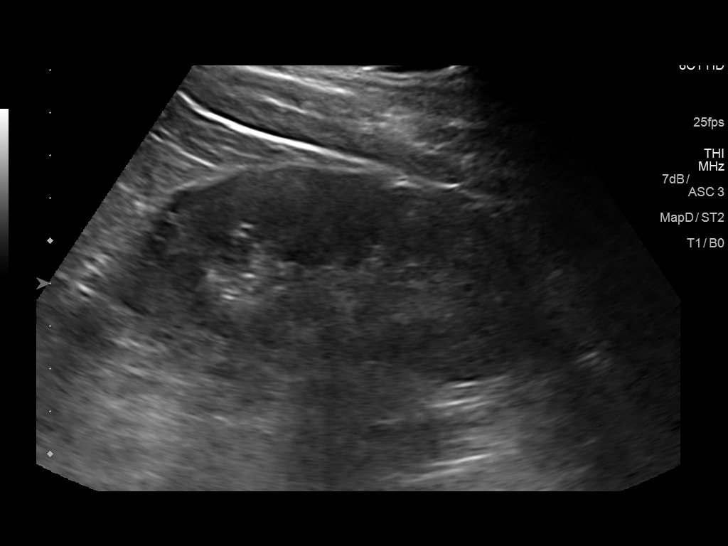
[im 36/44]
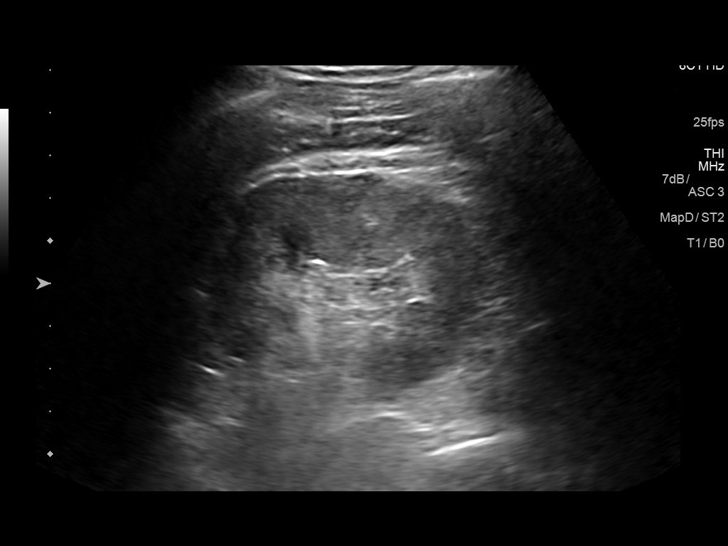
[im 40/44]
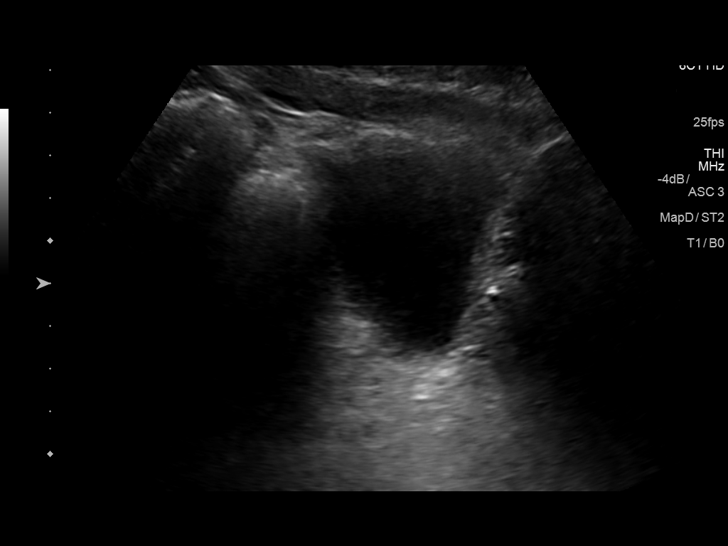
[im 44/44]
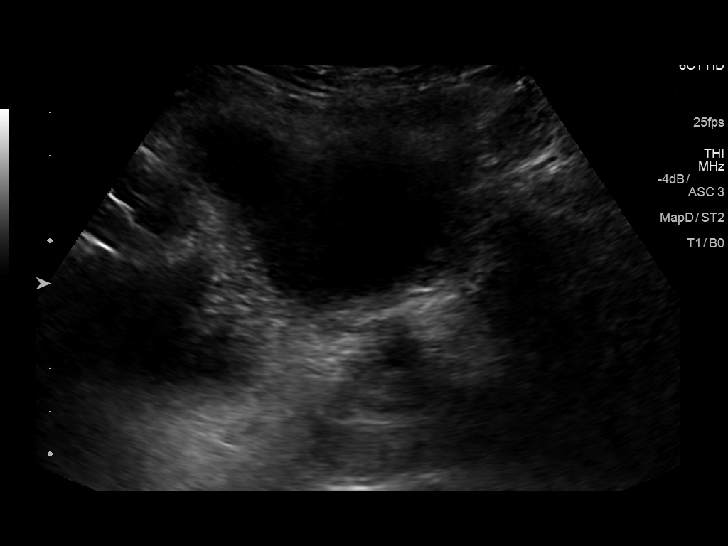

[14 of 25 positions shown; findings below may reference images not displayed]

FINDINGS: Right Kidney:

Length: 11.2 cm. Echogenicity within normal limits. No mass or
hydronephrosis visualized.

Left Kidney:

Length: 11.8 cm. Echogenicity within normal limits. No mass or
hydronephrosis visualized.

Bladder:

Appears normal for degree of bladder distention.
IMPRESSION: No acute or focal abnormality identified.

## 2018-01-30 ENCOUNTER — Ambulatory Visit: Payer: Medicare Other | Admitting: Hematology and Oncology

## 2018-01-30 ENCOUNTER — Other Ambulatory Visit: Payer: Medicare Other

## 2018-02-04 ENCOUNTER — Other Ambulatory Visit: Payer: Self-pay | Admitting: Hematology and Oncology

## 2018-02-04 DIAGNOSIS — R768 Other specified abnormal immunological findings in serum: Secondary | ICD-10-CM

## 2018-02-05 ENCOUNTER — Inpatient Hospital Stay: Payer: Medicare Other | Admitting: Hematology and Oncology

## 2018-02-05 ENCOUNTER — Inpatient Hospital Stay: Payer: Medicare Other | Attending: Hematology and Oncology

## 2018-10-11 ENCOUNTER — Other Ambulatory Visit: Payer: Self-pay | Admitting: Cardiology

## 2020-12-08 ENCOUNTER — Other Ambulatory Visit: Payer: Self-pay | Admitting: Cardiology

## 2020-12-08 DIAGNOSIS — I10 Essential (primary) hypertension: Secondary | ICD-10-CM

## 2020-12-08 MED ORDER — NIFEDIPINE ER OSMOTIC RELEASE 30 MG PO TB24: 30.0000 mg | ORAL_TABLET | ORAL | 3 refills | Status: AC

## 2021-12-22 ENCOUNTER — Other Ambulatory Visit: Payer: Self-pay | Admitting: Cardiology

## 2021-12-22 DIAGNOSIS — I10 Essential (primary) hypertension: Secondary | ICD-10-CM

## 2023-08-20 ENCOUNTER — Ambulatory Visit: Payer: Medicare Other | Admitting: Cardiology

## 2023-08-20 NOTE — Progress Notes (Deleted)
  Cardiology Office Note:  .   Date:  08/20/2023  ID:  Jacob Pope, DOB [birth year], MRN [id number] PCP: Georgianne Fick, MD  [facility] Providers Cardiologist:  None { Click to update primary MD,subspecialty MD or APP then REFRESH:1}  History of Present Illness: .   Jacob Pope is a 83 y.o. Patient with long history of hypertension, chronic stage IIIa-B CKD, polyclonal gammopathy of unknown significance in spite of extensive evaluation by hematology in the past, presents to reestablish care, I had seen him approximately 6 to 7 years ago.  He [place] between [place] and [place] now, his daughter lives here. Discussed the use of AI scribe software for clinical note transcription with the patient, who gave verbal consent to proceed.  History of Present Illness   Labs   External Labs:  ***  ***ROS Physical Exam:   VS:  There were no vitals taken for this visit.   Wt Readings from Last 3 Encounters:  01/30/17 135 lb 12.8 oz (61.6 kg)  12/27/16 136 lb 1.6 oz (61.7 kg)  12/18/16 135 lb (61.2 kg)     ***Physical Exam Studies Reviewed: .    *** EKG:         ***  Medications and allergies    No Known Allergies   Current Outpatient Medications:    atenolol (TENORMIN) 25 MG tablet, Take 25 mg by mouth daily as needed (chest pain). , Disp: , Rfl:    NIFEdipine (PROCARDIA-XL/NIFEDICAL-XL) 30 MG 24 hr tablet, Take 1 tablet (30 mg total) by mouth as directed. 1 to 2 tablets daily as directed., Disp: 180 tablet, Rfl: 3   No orders of the defined types were placed in this encounter.    There are no discontinued medications.   ASSESSMENT AND PLAN: .      ICD-10-CM   1. Primary hypertension  I10     2. Stage 3a chronic kidney disease (HCC)  N18.31       1. Primary hypertension ***  2. Stage 3a chronic kidney disease [facility]) ***  Assessment & Plan        Signed,  Yates Decamp, MD, [facility] 08/20/2023, 7:11 AM [facility] [address] [place],  [place] [address] Phone: [phone]. Fax:  [phone]
